# Patient Record
Sex: Male | Born: 1955 | Race: White | Hispanic: No | Marital: Married | Smoking: Former smoker
Health system: Southern US, Community
[De-identification: ages and names within clinical notes are randomized; demographics above are authoritative.]

## PROBLEM LIST (undated history)

## (undated) DIAGNOSIS — H269 Unspecified cataract: Secondary | ICD-10-CM

## (undated) DIAGNOSIS — E785 Hyperlipidemia, unspecified: Secondary | ICD-10-CM

## (undated) DIAGNOSIS — E1149 Type 2 diabetes mellitus with other diabetic neurological complication: Secondary | ICD-10-CM

## (undated) DIAGNOSIS — F419 Anxiety disorder, unspecified: Secondary | ICD-10-CM

## (undated) DIAGNOSIS — T783XXA Angioneurotic edema, initial encounter: Secondary | ICD-10-CM

## (undated) DIAGNOSIS — E119 Type 2 diabetes mellitus without complications: Secondary | ICD-10-CM

## (undated) DIAGNOSIS — L309 Dermatitis, unspecified: Secondary | ICD-10-CM

## (undated) DIAGNOSIS — K635 Polyp of colon: Secondary | ICD-10-CM

## (undated) DIAGNOSIS — T7840XA Allergy, unspecified, initial encounter: Secondary | ICD-10-CM

## (undated) DIAGNOSIS — K219 Gastro-esophageal reflux disease without esophagitis: Secondary | ICD-10-CM

## (undated) HISTORY — DX: Allergy, unspecified, initial encounter: T78.40XA

## (undated) HISTORY — DX: Type 2 diabetes mellitus with other diabetic neurological complication: E11.49

## (undated) HISTORY — DX: Angioneurotic edema, initial encounter: T78.3XXA

## (undated) HISTORY — DX: Hyperlipidemia, unspecified: E78.5

## (undated) HISTORY — PX: COLONOSCOPY W/ BIOPSIES AND POLYPECTOMY: SHX1376

## (undated) HISTORY — DX: Unspecified cataract: H26.9

## (undated) HISTORY — DX: Dermatitis, unspecified: L30.9

## (undated) HISTORY — PX: RETINAL DETACHMENT SURGERY: SHX105

## (undated) HISTORY — DX: Polyp of colon: K63.5

## (undated) HISTORY — PX: WISDOM TOOTH EXTRACTION: SHX21

## (undated) HISTORY — DX: Anxiety disorder, unspecified: F41.9

## (undated) HISTORY — DX: Type 2 diabetes mellitus without complications: E11.9

## (undated) HISTORY — DX: Gastro-esophageal reflux disease without esophagitis: K21.9

---

## 2015-08-28 ENCOUNTER — Other Ambulatory Visit (HOSPITAL_COMMUNITY): Payer: Self-pay | Admitting: Family

## 2015-08-28 ENCOUNTER — Other Ambulatory Visit (HOSPITAL_COMMUNITY): Payer: Self-pay | Admitting: *Deleted

## 2015-08-28 DIAGNOSIS — S60222A Contusion of left hand, initial encounter: Secondary | ICD-10-CM

## 2015-08-28 DIAGNOSIS — M25512 Pain in left shoulder: Secondary | ICD-10-CM

## 2015-09-13 ENCOUNTER — Ambulatory Visit (HOSPITAL_COMMUNITY): Admission: RE | Admit: 2015-09-13 | Payer: Worker's Compensation | Source: Ambulatory Visit

## 2015-09-24 ENCOUNTER — Ambulatory Visit (HOSPITAL_COMMUNITY): Admission: RE | Admit: 2015-09-24 | Payer: Worker's Compensation | Source: Ambulatory Visit

## 2017-02-23 ENCOUNTER — Ambulatory Visit: Payer: Self-pay | Admitting: Surgery

## 2017-02-23 NOTE — H&P (Signed)
Danny Morgan 02/23/2017 10:15 AM Location: [facility] Patient #: [id number] DOB: 01-Jul-1956 Undefined / Language: Cleophus Molt / Race: White Male  History of Present Illness Adin Hector MD; 02/23/2017 11:59 AM) The patient is a 61 year old male who presents with an umbilical hernia. Note for "Umbilical hernia": ` ` ` Patient sent for surgical consultation at the request of Dr. Synetta Shadow  Chief Complaint: Umbilical hernia  The patient is a active male. Felt some abdominal pain and discomfort will always doing some lifting at work. 29th of January. Felt a definite pop. Then started noticing a lump. Through his Worker's Compensation evaluation, examination was suspicious for an umbilical hernia. He's noticed since gotten a little bit larger. It started to bother him more. Surgical consultation requested. The patient comes in today with his wife. He's been diagnosed with diabetes. Has not had his glucoses checked in a while. He is working to try and get his health on her better order. He otherwise is rather active. Hard to stay pleasant. He's had acute episodes of chest pressure anxiety. Wondered if he had panic attacks. Never really had any chest depression or discomfort at work or with lifting. Bowels every day. No bowel bouts of constipation diarrhea. Father had myocardial infarction but was in his 61s. Mother passed away which they think maybe from a stroke. He does not smoke. He believes he was diagnosed with sleep apnea from her prior employee evaluation but could not tolerate the CPAP machine due to recurrent sinisitis infections despite changing masks and cleaning regimens. Wife notes he snores if he lies on his back, but he intentionally does not sleep that way more. He tries to avoid and it is not as much of an issue.  (Review of systems as stated in this history (HPI) or in the review of systems. Otherwise all other 12 point ROS are  negative)   Past Surgical History Dalbert Mayotte, [place]; 02/23/2017 10:15 AM) No pertinent past surgical history  Diagnostic Studies History Dalbert Mayotte, [place]; 02/23/2017 10:15 AM) Colonoscopy never  Allergies Dalbert Mayotte, CMA; 02/23/2017 10:18 AM) No Known Drug Allergies 02/23/2017 Allergies Reconciled  Medication History Dalbert Mayotte, CMA; 02/23/2017 10:18 AM) MetFORMIN HCl (850MG  Tablet, Oral) Active. GlipiZIDE ER (5MG  Tablet ER 24HR, Oral) Active. Medications Reconciled  Social History Dalbert Mayotte, [place]; 02/23/2017 10:15 AM) Alcohol use Occasional alcohol use. Caffeine use Coffee. No drug use Tobacco use Former smoker.  Family History Dalbert Mayotte, [place]; 02/23/2017 10:15 AM) Family history unknown First Degree Relatives  Other Problems Dalbert Mayotte, CMA; 02/23/2017 10:15 AM) Anxiety Disorder Diabetes Mellitus Gastroesophageal Reflux Disease Hemorrhoids Migraine Headache Umbilical Hernia Repair     Review of Systems Dalbert Mayotte CMA; 02/23/2017 10:15 AM) General Not Present- Appetite Loss, Chills, Fatigue, Fever, Night Sweats, Weight Gain and Weight Loss. HEENT Present- Nose Bleed and Wears glasses/contact lenses. Not Present- Earache, Hearing Loss, Hoarseness, Oral Ulcers, Ringing in the Ears, Seasonal Allergies, Sinus Pain, Sore Throat, Visual Disturbances and Yellow Eyes. Respiratory Present- Snoring. Not Present- Bloody sputum, Chronic Cough, Difficulty Breathing and Wheezing. Breast Not Present- Breast Mass, Breast Pain, Nipple Discharge and Skin Changes. Cardiovascular Present- Palpitations and Swelling of Extremities. Not Present- Chest Pain, Difficulty Breathing Lying Down, Leg Cramps, Rapid Heart Rate and Shortness of Breath. Gastrointestinal Present- Bloating, Hemorrhoids and Indigestion. Not Present- Abdominal Pain, Bloody Stool, Change in Bowel Habits, Chronic diarrhea, Constipation, Difficulty Swallowing, Excessive gas, Gets  full quickly at meals, Nausea, Rectal Pain and Vomiting. Male Genitourinary Present- Impotence. Not  Present- Blood in Urine, Change in Urinary Stream, Frequency, Nocturia, Painful Urination, Urgency and Urine Leakage.  Vitals Dalbert Mayotte CMA; 02/23/2017 10:19 AM) 02/23/2017 10:18 AM Weight: 394.2 lb Height: 71in Body Surface Area: 2.81 m Body Mass Index: 54.98 kg/m  Temp.: 97.102F  Pulse: 83 (Regular)  BP: 150/100 (Sitting, Left Arm, Standard)      Physical Exam Adin Hector MD; 02/23/2017 10:51 AM)  General Mental Status-Alert. General Appearance-Not in acute distress, Not Sickly. Orientation-Oriented X3. Hydration-Well hydrated. Voice-Normal.  Integumentary Global Assessment Upon inspection and palpation of skin surfaces of the - Axillae: non-tender, no inflammation or ulceration, no drainage. and Distribution of scalp and body hair is normal. General Characteristics Temperature - normal warmth is noted.  Head and Neck Head-normocephalic, atraumatic with no lesions or palpable masses. Face Global Assessment - atraumatic, no absence of expression. Neck Global Assessment - no abnormal movements, no bruit auscultated on the right, no bruit auscultated on the left, no decreased range of motion, non-tender. Trachea-midline. Thyroid Gland Characteristics - non-tender.  Eye Eyeball - Left-Extraocular movements intact, No Nystagmus. Eyeball - Right-Extraocular movements intact, No Nystagmus. Cornea - Left-No Hazy. Cornea - Right-No Hazy. Sclera/Conjunctiva - Left-No scleral icterus, No Discharge. Sclera/Conjunctiva - Right-No scleral icterus, No Discharge. Pupil - Left-Direct reaction to light normal. Pupil - Right-Direct reaction to light normal.  ENMT Ears Pinna - Left - no drainage observed, no generalized tenderness observed. Right - no drainage observed, no generalized tenderness observed. Nose and Sinuses External  Inspection of the Nose - no destructive lesion observed. Inspection of the nares - Left - quiet respiration. Right - quiet respiration. Mouth and Throat Lips - Upper Lip - no fissures observed, no pallor noted. Lower Lip - no fissures observed, no pallor noted. Nasopharynx - no discharge present. Oral Cavity/Oropharynx - Tongue - no dryness observed. Oral Mucosa - no cyanosis observed. Hypopharynx - no evidence of airway distress observed.  Chest and Lung Exam Inspection Movements - Normal and Symmetrical. Accessory muscles - No use of accessory muscles in breathing. Palpation Palpation of the chest reveals - Non-tender. Auscultation Breath sounds - Normal and Clear.  Cardiovascular Auscultation Rhythm - Regular. Murmurs & Other Heart Sounds - Auscultation of the heart reveals - No Murmurs and No Systolic Clicks.  Abdomen Inspection Inspection of the abdomen reveals - No Visible peristalsis and No Abnormal pulsations. Umbilicus - No Bleeding, No Urine drainage. Palpation/Percussion Palpation and Percussion of the abdomen reveal - Soft, Non Tender, No Rebound tenderness, No Rigidity (guarding) and No Cutaneous hyperesthesia. Note: Overweight but soft. Mild suprapubic umbilical diastases recti. 2-1/2 cm mass that reduces down consistent with umbilical hernia. Sensitive but reducible. Rest the abdomen nontender. No guarding.  Male Genitourinary Sexual Maturity Tanner 5 - Adult hair pattern and Adult penile size and shape. Note: Definite right inguinal hernia with Valsalva. Most likely indirect/lateral. No definite hernia on the left side. External genitalia.  Peripheral Vascular Upper Extremity Inspection - Left - No Cyanotic nailbeds, Not Ischemic. Right - No Cyanotic nailbeds, Not Ischemic.  Neurologic Neurologic evaluation reveals -normal attention span and ability to concentrate, able to name objects and repeat phrases. Appropriate fund of knowledge , normal sensation and normal  coordination. Mental Status Affect - not angry, not paranoid. Cranial Nerves-Normal Bilaterally. Gait-Normal.  Neuropsychiatric Mental status exam performed with findings of-able to articulate well with normal speech/language, rate, volume and coherence, thought content normal with ability to perform basic computations and apply abstract reasoning and no evidence of hallucinations, delusions, obsessions  or homicidal/suicidal ideation.  Musculoskeletal Global Assessment Spine, Ribs and Pelvis - no instability, subluxation or laxity. Right Upper Extremity - no instability, subluxation or laxity.  Lymphatic Head & Neck  General Head & Neck Lymphatics: Bilateral - Description - No Localized lymphadenopathy. Axillary  General Axillary Region: Bilateral - Description - No Localized lymphadenopathy. Femoral & Inguinal  Generalized Femoral & Inguinal Lymphatics: Left - Description - No Localized lymphadenopathy. Right - Description - No Localized lymphadenopathy.    Assessment & Plan Adin Hector MD; 7/82/4235 36:14 AM)  UMBILICAL HERNIA WITHOUT OBSTRUCTION AND WITHOUT GANGRENE (K42.9) Impression: Umbilical hernia related to lifting episode. Now becoming larger and more symptomatic.  I think he would benefit from surgical repair. Given his size and physical activity, am skeptical stitches only will be adequate. Therefore mesh underlay repair. He is interested in proceeding. Wife is as well.  Current Plans The anatomy & physiology of the abdominal wall was discussed. The pathophysiology of hernias was discussed. Natural history risks without surgery including progeressive enlargement, pain, incarceration, & strangulation was discussed. Contributors to complications such as smoking, obesity, diabetes, prior surgery, etc were discussed.  I feel the risks of no intervention will lead to serious problems that outweigh the operative risks; therefore, I recommended surgery to  reduce and repair the hernia. I explained laparoscopic techniques with possible need for an open approach. I noted the probable use of mesh to patch and/or buttress the hernia repair  Risks such as bleeding, infection, abscess, need for further treatment, heart attack, death, and other risks were discussed. I noted a good likelihood this will help address the problem. Goals of post-operative recovery were discussed as well. Possibility that this will not correct all symptoms was explained. I stressed the importance of low-impact activity, aggressive pain control, avoiding constipation, & not pushing through pain to minimize risk of post-operative chronic pain or injury. Possibility of reherniation especially with smoking, obesity, diabetes, immunosuppression, and other health conditions was discussed. We will work to minimize complications.  An educational handout further explaining the pathology & treatment options was given as well. Questions were answered. The patient expresses understanding & wishes to proceed with surgery.  RIGHT INGUINAL HERNIA (K40.90) Impression: Definite right inguinal hernia. Less symptomatic. I would be inclined to fix this as well since it will get larger and become an issue at some point. He would like to be aggressive and repair but does not know if that will be covered. We will post and see if that is possibility. I would do diagnostic laparoscopy to make sure that he does not have a contralateral hernia that is even more subtle. Did note there is and over 50% chance he could have a contralateral inguinal hernia. I would like to avoid multiple operations and address all potential hernias with one surgery/ anesthesia. Still most likely be outpatient surgery.  Current Plans The anatomy & physiology of the abdominal wall and pelvic floor was discussed. The pathophysiology of hernias in the inguinal and pelvic region was discussed. Natural history risks such as  progressive enlargement, pain, incarceration, and strangulation was discussed. Contributors to complications such as smoking, obesity, diabetes, prior surgery, etc were discussed.  I feel the risks of no intervention will lead to serious problems that outweigh the operative risks; therefore, I recommended surgery to reduce and repair the hernia. I explained laparoscopic techniques with possible need for an open approach. I noted usual use of mesh to patch and/or buttress hernia repair  Risks such as bleeding,  infection, abscess, need for further treatment, heart attack, death, and other risks were discussed. I noted a good likelihood this will help address the problem. Goals of post-operative recovery were discussed as well. Possibility that this will not correct all symptoms was explained. I stressed the importance of low-impact activity, aggressive pain control, avoiding constipation, & not pushing through pain to minimize risk of post-operative chronic pain or injury. Possibility of reherniation was discussed. We will work to minimize complications.  An educational handout further explaining the pathology & treatment options was given as well. Questions were answered. The patient expresses understanding & wishes to proceed with surgery.  PREOP - [place] - ENCOUNTER FOR PREOPERATIVE EXAMINATION FOR GENERAL SURGICAL PROCEDURE (Z01.818)  Current Plans You are being scheduled for surgery- Our schedulers will call you.  You should hear from our office's scheduling department within 5 working days about the location, date, and time of surgery. We try to make accommodations for patient's preferences in scheduling surgery, but sometimes the OR schedule or the surgeon's schedule prevents [place] from making those accommodations.  If you have not heard from our office [phone]) in 5 working days, call the office and ask for your surgeon's nurse.  If you have other questions about your  diagnosis, plan, or surgery, call the office and ask for your surgeon's nurse.  Written instructions provided Pt Education - [facility] Hernia Post-Op HCI (Odis Wickey): discussed with patient and provided information. Pt Education - [facility] Pain Control (Romano Stigger) Pt Education - Pamphlet Given - Laparoscopic Hernia Repair: discussed with patient and provided information.  Adin Hector, M.D., F.A.C.S. Gastrointestinal and Minimally Invasive Surgery [facility] [address], [place], [place] [id number] [phone] Main / Paging

## 2017-09-23 ENCOUNTER — Encounter: Payer: Self-pay | Admitting: Gastroenterology

## 2017-10-25 ENCOUNTER — Telehealth: Payer: Self-pay | Admitting: *Deleted

## 2017-10-25 NOTE — Telephone Encounter (Signed)
Patient no show PV today. Called pt, no answer, left message for pt to call us back to reschedule pv before 5 pm today.

## 2017-10-29 ENCOUNTER — Telehealth: Payer: Self-pay | Admitting: *Deleted

## 2017-10-29 ENCOUNTER — Other Ambulatory Visit: Payer: Self-pay

## 2017-10-29 ENCOUNTER — Ambulatory Visit (AMBULATORY_SURGERY_CENTER): Payer: Self-pay | Admitting: *Deleted

## 2017-10-29 VITALS — Ht 71.0 in | Wt 293.2 lb

## 2017-10-29 DIAGNOSIS — Z1211 Encounter for screening for malignant neoplasm of colon: Secondary | ICD-10-CM

## 2017-10-29 MED ORDER — NA SULFATE-K SULFATE-MG SULF 17.5-3.13-1.6 GM/177ML PO SOLN: 1.0000 [IU] | Freq: Once | ORAL | 0 refills | Status: AC

## 2017-10-29 NOTE — Telephone Encounter (Signed)
Patient no showed for pre visit appointment today at 11 am. Attempted to call patient and no answer.  Left detailed message on voicemail for patient to call and reschedule his pre visit by 5 pm today.  Advised if I did not hear back by 5 today, I will cancel both pre visit appointment and colonoscopy appointment.   Elizabeth Palau, CMA  PV

## 2017-10-29 NOTE — Progress Notes (Signed)
No egg or soy allergy known to patient  No issues with past sedation with any surgeries  or procedures, no intubation problems  Never been sedated  No diet pills per patient No home 02 use per patient  No blood thinners per patient  Pt denies issues with constipation  No A fib or A flutter  EMMI video sent to pt's e mail

## 2017-11-01 ENCOUNTER — Encounter: Payer: Self-pay | Admitting: Gastroenterology

## 2017-11-01 NOTE — Telephone Encounter (Signed)
Patient came in at later time for PV.  B.Benitez, CMA  PV

## 2017-11-08 ENCOUNTER — Other Ambulatory Visit: Payer: Self-pay

## 2017-11-08 ENCOUNTER — Ambulatory Visit (AMBULATORY_SURGERY_CENTER): Payer: 59 | Admitting: Gastroenterology

## 2017-11-08 ENCOUNTER — Encounter: Payer: Self-pay | Admitting: Gastroenterology

## 2017-11-08 VITALS — BP 115/75 | HR 69 | Temp 98.4°F | Resp 12 | Ht 71.0 in | Wt 293.0 lb

## 2017-11-08 DIAGNOSIS — D128 Benign neoplasm of rectum: Secondary | ICD-10-CM

## 2017-11-08 DIAGNOSIS — Z1211 Encounter for screening for malignant neoplasm of colon: Secondary | ICD-10-CM

## 2017-11-08 DIAGNOSIS — D122 Benign neoplasm of ascending colon: Secondary | ICD-10-CM

## 2017-11-08 DIAGNOSIS — D123 Benign neoplasm of transverse colon: Secondary | ICD-10-CM | POA: Diagnosis not present

## 2017-11-08 DIAGNOSIS — D127 Benign neoplasm of rectosigmoid junction: Secondary | ICD-10-CM

## 2017-11-08 MED ORDER — SODIUM CHLORIDE 0.9 % IV SOLN: 500.0000 mL | Freq: Once | INTRAVENOUS | Status: DC

## 2017-11-08 NOTE — Progress Notes (Signed)
Called to room to assist during endoscopic procedure.  Patient ID and intended procedure confirmed with present staff. Received instructions for my participation in the procedure from the performing physician.  

## 2017-11-08 NOTE — Progress Notes (Signed)
No changes in medical or surgical hx since PV per pt 

## 2017-11-08 NOTE — Progress Notes (Signed)
Report given to PACU, vss 

## 2017-11-08 NOTE — Op Note (Signed)
[facility] Patient Name: Danny Morgan Procedure Date: 11/08/2017 7:49 AM MRN: [id number] Endoscopist: Remo Lipps P. Ingris Pasquarella MD, MD Age: 62 Referring MD:  Date of Birth: 1956/05/29 Gender: Male Account #: [id number] Procedure:                Colonoscopy Indications:              Screening for colorectal malignant neoplasm, This                            is the patient's first colonoscopy Medicines:                Monitored Anesthesia Care Procedure:                Pre-Anesthesia Assessment:                           - Prior to the procedure, a History and Physical                            was performed, and patient medications and                            allergies were reviewed. The patient's tolerance of                            previous anesthesia was also reviewed. The risks                            and benefits of the procedure and the sedation                            options and risks were discussed with the patient.                            All questions were answered, and informed consent                            was obtained. Prior Anticoagulants: The patient has                            taken no previous anticoagulant or antiplatelet                            agents. ASA Grade Assessment: II - A patient with                            mild systemic disease. After reviewing the risks                            and benefits, the patient was deemed in                            satisfactory condition to undergo the procedure.  After obtaining informed consent, the colonoscope                            was passed under direct vision. Throughout the                            procedure, the patient's blood pressure, pulse, and                            oxygen saturations were monitored continuously. The                            Colonoscope was introduced through the anus and                            advanced to the the  cecum, identified by                            appendiceal orifice and ileocecal valve. The                            colonoscopy was performed without difficulty. The                            patient tolerated the procedure well. The quality                            of the bowel preparation was good. The ileocecal                            valve, appendiceal orifice, and rectum were                            photographed. Scope In: 8:00:34 AM Scope Out: 8:23:05 AM Scope Withdrawal Time: 0 hours 18 minutes 14 seconds  Total Procedure Duration: 0 hours 22 minutes 31 seconds  Findings:                 The perianal and digital rectal examinations were                            normal.                           A 4 mm polyp was found in the ascending colon. The                            polyp was sessile. The polyp was removed with a                            cold snare. Resection and retrieval were complete.                           Three sessile polyps were found in the transverse  colon. The polyps were 4 to 5 mm in size. These                            polyps were removed with a cold snare. Resection                            and retrieval were complete.                           Two semi-pedunculated polyps were found in the                            recto-sigmoid colon. The polyps were 6 to 7 mm in                            size. These polyps were removed with a hot snare.                            Resection and retrieval were complete.                           A 4 mm polyp was found in the rectum. The polyp was                            sessile. The polyp was removed with a cold snare.                            Resection and retrieval were complete.                           A few small-mouthed diverticula were found in the                            sigmoid colon.                           Internal hemorrhoids were found during                             retroflexion. The hemorrhoids were moderate.                           The exam was otherwise without abnormality. Complications:            No immediate complications. Estimated blood loss:                            Minimal. Estimated Blood Loss:     Estimated blood loss was minimal. Impression:               - One 4 mm polyp in the ascending colon, removed                            with a cold snare. Resected and retrieved.                           -  Three 4 to 5 mm polyps in the transverse colon,                            removed with a cold snare. Resected and retrieved.                           - Two 6 to 7 mm polyps at the recto-sigmoid colon,                            removed with a hot snare. Resected and retrieved.                           - One 4 mm polyp in the rectum, removed with a cold                            snare. Resected and retrieved.                           - Diverticulosis in the sigmoid colon.                           - Internal hemorrhoids.                           - The examination was otherwise normal. Recommendation:           - Patient has a contact number available for                            emergencies. The signs and symptoms of potential                            delayed complications were discussed with the                            patient. Return to normal activities tomorrow.                            Written discharge instructions were provided to the                            patient.                           - Resume previous diet.                           - Continue present medications.                           - Await pathology results.                           - Repeat colonoscopy is recommended for  surveillance. The colonoscopy date will be                            determined after pathology results from today's                            exam become available for review.                            - No ibuprofen, naproxen, or other non-steroidal                            anti-inflammatory drugs for 2 weeks after polyp                            removal. Remo Lipps P. Jayden Kratochvil MD, MD 11/08/2017 8:28:18 AM This report has been signed electronically.

## 2017-11-08 NOTE — Patient Instructions (Signed)
Discharge instructions given. Handouts on polyps,diverticulosis and hemorrhoids. Resume previous medications. No ibuprofen, naproxen, or other NSAIDs for two weeks.YOU HAD AN ENDOSCOPIC PROCEDURE TODAY AT THE [facility]:   Refer to the procedure report that was given to you for any specific questions about what was found during the examination.  If the procedure report does not answer your questions, please call your gastroenterologist to clarify.  If you requested that your care partner not be given the details of your procedure findings, then the procedure report has been included in a sealed envelope for you to review at your convenience later.  YOU SHOULD EXPECT: Some feelings of bloating in the abdomen. Passage of more gas than usual.  Walking can help get rid of the air that was put into your GI tract during the procedure and reduce the bloating. If you had a lower endoscopy (such as a colonoscopy or flexible sigmoidoscopy) you may notice spotting of blood in your stool or on the toilet paper. If you underwent a bowel prep for your procedure, you may not have a normal bowel movement for a few days.  Please Note:  You might notice some irritation and congestion in your nose or some drainage.  This is from the oxygen used during your procedure.  There is no need for concern and it should clear up in a day or so.  SYMPTOMS TO REPORT IMMEDIATELY:   Following lower endoscopy (colonoscopy or flexible sigmoidoscopy):  Excessive amounts of blood in the stool  Significant tenderness or worsening of abdominal pains  Swelling of the abdomen that is new, acute  Fever of 100F or higher   For urgent or emergent issues, a gastroenterologist can be reached at any hour by calling [phone].   DIET:  We do recommend a small meal at first, but then you may proceed to your regular diet.  Drink plenty of fluids but you should avoid alcoholic beverages for 24 hours.  ACTIVITY:  You should  plan to take it easy for the rest of today and you should NOT DRIVE or use heavy machinery until tomorrow (because of the sedation medicines used during the test).    FOLLOW UP: Our staff will call the number listed on your records the next business day following your procedure to check on you and address any questions or concerns that you may have regarding the information given to you following your procedure. If we do not reach you, we will leave a message.  However, if you are feeling well and you are not experiencing any problems, there is no need to return our call.  We will assume that you have returned to your regular daily activities without incident.  If any biopsies were taken you will be contacted by phone or by letter within the next 1-3 weeks.  Please call us at [phone] if you have not heard about the biopsies in 3 weeks.    SIGNATURES/CONFIDENTIALITY: You and/or your care partner have signed paperwork which will be entered into your electronic medical record.  These signatures attest to the fact that that the information above on your After Visit Summary has been reviewed and is understood.  Full responsibility of the confidentiality of this discharge information lies with you and/or your care-partner. 

## 2017-11-09 ENCOUNTER — Telehealth: Payer: Self-pay

## 2017-11-09 NOTE — Telephone Encounter (Signed)
Pt is returning your call and has some concerns he would like to discuss [phone]

## 2017-11-09 NOTE — Telephone Encounter (Signed)
  Follow up Call-  Call back number 11/08/2017  Post procedure Call Back phone  # [phone]  Permission to leave phone message Yes     Patient questions:  Do you have a fever, pain , or abdominal swelling? No. Pain Score  0 *  Have you tolerated food without any problems? Yes.    Have you been able to return to your normal activities? Yes.    Do you have any questions about your discharge instructions: Diet   No. Medications  No. Follow up visit  No.  Do you have questions or concerns about your Care? No.  Actions: * If pain score is 4 or above: No action needed, pain <4.  Patient said he was cramping a little this morning but feeling better after moving around. Also said his anus was sore, I instructed him to use some Desitin or A&D ointment if he needed to. He understood and wanted to thank everyone for the excellent care he received during his procedure.

## 2017-11-09 NOTE — Telephone Encounter (Signed)
  Follow up Call-  Call back number 11/08/2017  Post procedure Call Back phone  # [phone]  Permission to leave phone message Yes     Left message

## 2017-11-12 ENCOUNTER — Encounter: Payer: Self-pay | Admitting: Gastroenterology

## 2019-08-24 DIAGNOSIS — Z20828 Contact with and (suspected) exposure to other viral communicable diseases: Secondary | ICD-10-CM | POA: Diagnosis not present

## 2019-08-29 DIAGNOSIS — Z20828 Contact with and (suspected) exposure to other viral communicable diseases: Secondary | ICD-10-CM | POA: Diagnosis not present

## 2019-09-06 DIAGNOSIS — U071 COVID-19: Secondary | ICD-10-CM | POA: Diagnosis not present

## 2019-09-06 DIAGNOSIS — J329 Chronic sinusitis, unspecified: Secondary | ICD-10-CM | POA: Diagnosis not present

## 2019-09-28 DIAGNOSIS — E119 Type 2 diabetes mellitus without complications: Secondary | ICD-10-CM | POA: Diagnosis not present

## 2019-09-28 DIAGNOSIS — Z638 Other specified problems related to primary support group: Secondary | ICD-10-CM | POA: Diagnosis not present

## 2019-09-28 DIAGNOSIS — U071 COVID-19: Secondary | ICD-10-CM | POA: Diagnosis not present

## 2019-09-28 DIAGNOSIS — G5793 Unspecified mononeuropathy of bilateral lower limbs: Secondary | ICD-10-CM | POA: Diagnosis not present

## 2019-10-13 DIAGNOSIS — U071 COVID-19: Secondary | ICD-10-CM | POA: Diagnosis not present

## 2019-10-13 DIAGNOSIS — R0602 Shortness of breath: Secondary | ICD-10-CM | POA: Diagnosis not present

## 2019-10-26 DIAGNOSIS — F4323 Adjustment disorder with mixed anxiety and depressed mood: Secondary | ICD-10-CM | POA: Diagnosis not present

## 2019-10-26 DIAGNOSIS — G5793 Unspecified mononeuropathy of bilateral lower limbs: Secondary | ICD-10-CM | POA: Diagnosis not present

## 2019-10-26 DIAGNOSIS — U071 COVID-19: Secondary | ICD-10-CM | POA: Diagnosis not present

## 2019-10-26 DIAGNOSIS — E119 Type 2 diabetes mellitus without complications: Secondary | ICD-10-CM | POA: Diagnosis not present

## 2020-04-12 ENCOUNTER — Other Ambulatory Visit: Payer: Self-pay

## 2020-04-12 ENCOUNTER — Emergency Department (INDEPENDENT_AMBULATORY_CARE_PROVIDER_SITE_OTHER)
Admission: EM | Admit: 2020-04-12 | Discharge: 2020-04-12 | Disposition: A | Discharge status: Home / Self Care | Payer: BC Managed Care – PPO | Source: Home / Self Care | Attending: Family Medicine | Admitting: Family Medicine

## 2020-04-12 DIAGNOSIS — J069 Acute upper respiratory infection, unspecified: Secondary | ICD-10-CM

## 2020-04-12 NOTE — ED Triage Notes (Signed)
Patient presents to Urgent Care with complaints of nasal congestion and other viral illness sx since earlier this week. Patient reports someone else at his job just tested for covid, pt has been vaccinated and had covid last year (December).

## 2020-04-12 NOTE — ED Provider Notes (Signed)
[name]    CSN: [id number] Arrival date & time: 04/12/20  1309      History   Chief Complaint Chief Complaint  Patient presents with   Nasal Congestion    HPI Danny Morgan is a 64 y.o. male.   Yesterday patient developed cold-like symptoms including cough, sore throat, and sinus congestion.  He denies fevers, chills, and sweats.  He denies chest tightness, shortness of breath, and changes in taste/smell.    The history is provided by the patient.    Past Medical History:  Diagnosis Date   Allergy    Anxiety    Cataract    beginning stage in rt. eye   Diabetes mellitus without complication (HCC)    GERD (gastroesophageal reflux disease)     There are no problems to display for this patient.   Past Surgical History:  Procedure Laterality Date   RETINAL DETACHMENT SURGERY     age 64   [place] Medications    Prior to Admission medications   Medication Sig Start Date End Date Taking? Authorizing Provider  atorvastatin (LIPITOR) 40 MG tablet Take 1 tablet by mouth daily. 10/22/17   [provider]  Cetirizine-Pseudoephedrine (ZYRTEC-D PO) Take by mouth daily.    [provider]  Cholecalciferol (VITAMIN D PO) Take by mouth daily.    [provider]  diclofenac sodium (VOLTAREN) 1 % GEL AS DIRECTED EVERY 6-8 HOURS EXTERNALLY 14 DAYS 10/13/17   [provider]  glipiZIDE (GLUCOTROL XL) 10 MG 24 hr tablet Take 10 mg by mouth daily. 10/22/17   [provider]  ibuprofen (ADVIL,MOTRIN) 600 MG tablet 1 TABLET EVERY 6-8 HOURS BY MOUTH 07 DAYS 10/13/17   [provider]  Loratadine (CLARITIN PO) Take by mouth daily.    [provider]  LORazepam (ATIVAN) 1 MG tablet 1 mg as needed for anxiety.    [provider]  metFORMIN (GLUCOPHAGE) 1000 MG tablet 1 TABLET WITH A MEAL 2 TIMES A DAY ORALLY 90 DAYS 10/22/17   [provider]  omeprazole  (PRILOSEC) 10 MG capsule Take by mouth.    [provider]    Family History Family History  Problem Relation Age of Onset   Parkinson's disease Father    Colon polyps Neg Hx    Colon cancer Neg Hx    Esophageal cancer Neg Hx    Rectal cancer Neg Hx    Stomach cancer Neg Hx     Social History Social History   Tobacco Use   Smoking status: Former Smoker    Types: Cigarettes   Smokeless tobacco: Never Used   Tobacco comment: quit at age 64  Vaping Use   Vaping Use: Never used  Substance Use Topics   Alcohol use: Yes    Comment: rarely   Drug use: No     Allergies   Penicillins   Review of Systems Review of Systems + sore throat + cough No pleuritic pain No wheezing + nasal congestion + post-nasal drainage No sinus pain/pressure No itchy/red eyes No earache No hemoptysis No SOB No fever/chills No nausea No vomiting No abdominal pain No diarrhea No urinary symptoms No skin rash No fatigue No myalgias No headache Used OTC meds (Zicam)  without relief   Physical Exam Triage Vital Signs ED Triage Vitals  Enc Vitals Group     BP 04/12/20 1322 (!) 178/95     Pulse Rate  04/12/20 1322 83     Resp 04/12/20 1322 16     Temp 04/12/20 1322 99.4 F (37.4 C)     Temp Source 04/12/20 1322 Oral     SpO2 04/12/20 1322 99 %     Weight --      Height --      Head Circumference --      Peak Flow --      Pain Score 04/12/20 1320 0     Pain Loc --      Pain Edu? --      Excl. in [place]? --    No data found.  Updated Vital Signs BP (!) 178/95 (BP Location: Right Arm)    Pulse 83    Temp 99.4 F (37.4 C) (Oral)    Resp 16    SpO2 99%   Visual Acuity Right Eye Distance:   Left Eye Distance:   Bilateral Distance:    Right Eye Near:   Left Eye Near:    Bilateral Near:     Physical Exam Nursing notes and Vital Signs reviewed. Appearance:  Patient appears stated age, and in no acute distress Eyes:  Pupils are equal, round, and  reactive to light and accomodation.  Extraocular movement is intact.  Conjunctivae are not inflamed  Ears:  Canals normal.  Tympanic membranes normal.  Nose:  Mildly congested turbinates.  No sinus tenderness.  Pharynx:  Normal Neck:  Supple.  Mildly enlarged lateral nodes are present, tender to palpation on the left.   Lungs:  Clear to auscultation.  Breath sounds are equal.  Moving air well. Heart:  Regular rate and rhythm without murmurs, rubs, or gallops.  Abdomen:  Nontender without masses or hepatosplenomegaly.  Bowel sounds are present.  No CVA or flank tenderness.  Extremities:  No edema.  Skin:  No rash present.   UC Treatments / Results  Labs (all labs ordered are listed, but only abnormal results are displayed) Labs Reviewed  SARS-COV-2 RNA,(COVID-19) QUALITATIVE NAAT    EKG   Radiology No results found.  Procedures Procedures (including critical care time)  Medications Ordered in UC Medications - No data to display  Initial Impression / Assessment and Plan / UC Course  I have reviewed the triage vital signs and the nursing notes.  Pertinent labs & imaging results that were available during my care of the patient were reviewed by me and considered in my medical decision making (see chart for details).    Benign exam.  There is no evidence of bacterial infection today.  Treat symptomatically for now  COVID19 send out test.   Final Clinical Impressions(s) / UC Diagnoses   Final diagnoses:  Viral URI with cough     Discharge Instructions     Take plain guaifenesin (1200mg  extended release tabs such as Mucinex) twice daily, with plenty of water, for cough and congestion.  May add Pseudoephedrine (30mg , one or two every 4 to 6 hours) for sinus congestion.  Get adequate rest.   May use Afrin nasal spray (or generic oxymetazoline) each morning for about 5 days and then discontinue.  Also recommend using saline nasal spray several times daily and saline nasal  irrigation (AYR is a common brand).  Use Flonase nasal spray each morning after using Afrin nasal spray and saline nasal irrigation. Try warm salt water gargles for sore throat.  Stop all antihistamines for now, and other non-prescription cough/cold preparations. May take Delsym Cough Suppressant ("12 Hour Cough Relief") at bedtime  for nighttime cough.  May take Tylenol as needed for body aches, fever, etc.   Isolate yourself until COVID-19 test result is available.   If your COVID19 test is positive, then you are infected with the novel coronavirus and could give the virus to others.  Please continue isolation at home for at least 10 days since the start of your symptoms.  Once you complete your 10 day quarantine, you may return to normal activities as long as you've not had a fever for over 24 hours (without taking fever reducing medicine) and your symptoms are improving. Please continue good preventive care measures, including:  frequent hand-washing, avoid touching your face, cover coughs/sneezes, stay out of crowds and keep a 6 foot distance from others.  Go to the nearest hospital emergency room if fever/cough/breathlessness are severe or illness seems like a threat to life.     ED Prescriptions    None        Kandra Nicolas, MD 04/14/20 1253

## 2020-04-12 NOTE — Discharge Instructions (Addendum)
Take plain guaifenesin (1200mg  extended release tabs such as Mucinex) twice daily, with plenty of water, for cough and congestion.  May add Pseudoephedrine (30mg , one or two every 4 to 6 hours) for sinus congestion.  Get adequate rest.   May use Afrin nasal spray (or generic oxymetazoline) each morning for about 5 days and then discontinue.  Also recommend using saline nasal spray several times daily and saline nasal irrigation (AYR is a common brand).  Use Flonase nasal spray each morning after using Afrin nasal spray and saline nasal irrigation. Try warm salt water gargles for sore throat.  Stop all antihistamines for now, and other non-prescription cough/cold preparations. May take Delsym Cough Suppressant ("12 Hour Cough Relief") at bedtime for nighttime cough.  May take Tylenol as needed for body aches, fever, etc.   Isolate yourself until COVID-19 test result is available.   If your COVID19 test is positive, then you are infected with the novel coronavirus and could give the virus to others.  Please continue isolation at home for at least 10 days since the start of your symptoms.  Once you complete your 10 day quarantine, you may return to normal activities as long as you've not had a fever for over 24 hours (without taking fever reducing medicine) and your symptoms are improving. Please continue good preventive care measures, including:  frequent hand-washing, avoid touching your face, cover coughs/sneezes, stay out of crowds and keep a 6 foot distance from others.  Go to the nearest hospital emergency room if fever/cough/breathlessness are severe or illness seems like a threat to life.

## 2020-04-13 LAB — SARS-COV-2 RNA,(COVID-19) QUALITATIVE NAAT: SARS CoV2 RNA: NOT DETECTED

## 2020-06-05 ENCOUNTER — Emergency Department (INDEPENDENT_AMBULATORY_CARE_PROVIDER_SITE_OTHER): Payer: BC Managed Care – PPO

## 2020-06-05 ENCOUNTER — Other Ambulatory Visit: Payer: Self-pay

## 2020-06-05 ENCOUNTER — Emergency Department
Admission: EM | Admit: 2020-06-05 | Discharge: 2020-06-05 | Disposition: A | Discharge status: Home / Self Care | Payer: BC Managed Care – PPO | Source: Home / Self Care

## 2020-06-05 DIAGNOSIS — Z8616 Personal history of COVID-19: Secondary | ICD-10-CM

## 2020-06-05 DIAGNOSIS — R0602 Shortness of breath: Secondary | ICD-10-CM

## 2020-06-05 DIAGNOSIS — R9431 Abnormal electrocardiogram [ECG] [EKG]: Secondary | ICD-10-CM

## 2020-06-05 DIAGNOSIS — R059 Cough, unspecified: Secondary | ICD-10-CM

## 2020-06-05 DIAGNOSIS — E1165 Type 2 diabetes mellitus with hyperglycemia: Secondary | ICD-10-CM

## 2020-06-05 DIAGNOSIS — R911 Solitary pulmonary nodule: Secondary | ICD-10-CM | POA: Diagnosis not present

## 2020-06-05 DIAGNOSIS — R5383 Other fatigue: Secondary | ICD-10-CM

## 2020-06-05 LAB — POCT URINALYSIS DIP (MANUAL ENTRY)
Bilirubin, UA: NEGATIVE
Blood, UA: NEGATIVE
Glucose, UA: 250 mg/dL — AB
Leukocytes, UA: NEGATIVE
Nitrite, UA: NEGATIVE
Protein Ur, POC: NEGATIVE mg/dL
Spec Grav, UA: 1.02 (ref 1.010–1.025)
Urobilinogen, UA: 0.2 E.U./dL
pH, UA: 5 (ref 5.0–8.0)

## 2020-06-05 LAB — POCT FASTING CBG KUC MANUAL ENTRY: POCT Glucose (KUC): 226 mg/dL — AB (ref 70–99)

## 2020-06-05 MED ORDER — BENZONATATE 100 MG PO CAPS
100.0000 mg | ORAL_CAPSULE | Freq: Three times a day (TID) | ORAL | 0 refills | Status: DC
Start: 2020-06-05 — End: 2020-06-24

## 2020-06-05 MED ORDER — GLIPIZIDE 5 MG PO TABS
5.0000 mg | ORAL_TABLET | Freq: Every day | ORAL | 1 refills | Status: DC
Start: 2020-06-05 — End: 2020-08-12

## 2020-06-05 NOTE — ED Triage Notes (Signed)
Patient presents to Urgent Care with complaints of cough and congestion since July after he got his covid vaccines. Patient reports since yesterday he has been feeling fatigued, left work due to weakness. Feels like his heart skips a beat when he checks his pulse (since July). Pt is diabetic, states he stopped monitoring his blood sugar and goes by how he feels like when his toes tingle he knows it's high. Pt thinks his glucose normally runs about 200-220.

## 2020-06-05 NOTE — ED Provider Notes (Signed)
[name]    CSN: [id number] Arrival date & time: 06/05/20  7371      History   Chief Complaint Chief Complaint  Patient presents with   Cough    HPI Danny Morgan is a 64 y.o. male.   HPI Patient presents today with a concern of cough, congestion, fatigue and feeling weak. Cough and congestion has been present since July patient reports that this has been ongoing since he received both of his Covid vaccines in July. Patient reports that he has felt palpitation or that his heart skipped a beat which caused him to leave work yesterday due to weakness. He does not check his blood sugar however he has diabetes and reports prior to checking blood sugar his baseline was in the 200s. Currently managed with Metformin and glipizide. Has PCP out of town and has not been able to see them as the drive is too far. He also complains of worsening SOB and heart skipping. This  Has been on-going for several months also. BP elevated today and elevated during his visit here in August and he reports no history of hypertension nor prior treatment with antihypertension medications. Denies unintentional weight gain or bilateral lower extremity edema. Past Medical History:  Diagnosis Date   Allergy    Anxiety    Cataract    beginning stage in rt. eye   Diabetes mellitus without complication (HCC)    GERD (gastroesophageal reflux disease)     There are no problems to display for this patient.   Past Surgical History:  Procedure Laterality Date   RETINAL DETACHMENT SURGERY     age 49   [place] Medications    Prior to Admission medications   Medication Sig Start Date End Date Taking? Authorizing Provider  atorvastatin (LIPITOR) 40 MG tablet Take 1 tablet by mouth daily. 10/22/17   [provider]  benzonatate (TESSALON) 100 MG capsule Take 1 capsule (100 mg total) by mouth every 8 (eight) hours. 06/05/20   Scot Jun, FNP    Cetirizine-Pseudoephedrine (ZYRTEC-D PO) Take by mouth daily.    [provider]  Cholecalciferol (VITAMIN D PO) Take by mouth daily.    [provider]  diclofenac sodium (VOLTAREN) 1 % GEL AS DIRECTED EVERY 6-8 HOURS EXTERNALLY 14 DAYS 10/13/17   [provider]  glipiZIDE (GLUCOTROL XL) 10 MG 24 hr tablet Take 10 mg by mouth daily. 10/22/17   [provider]  glipiZIDE (GLUCOTROL) 5 MG tablet Take 1 tablet (5 mg total) by mouth daily with supper. 06/05/20   Scot Jun, FNP  ibuprofen (ADVIL,MOTRIN) 600 MG tablet 1 TABLET EVERY 6-8 HOURS BY MOUTH 07 DAYS 10/13/17   [provider]  Loratadine (CLARITIN PO) Take by mouth daily.    [provider]  LORazepam (ATIVAN) 1 MG tablet 1 mg as needed for anxiety.    [provider]  metFORMIN (GLUCOPHAGE) 1000 MG tablet 1 TABLET WITH A MEAL 2 TIMES A DAY ORALLY 90 DAYS 10/22/17   [provider]  omeprazole (PRILOSEC) 10 MG capsule Take by mouth.    [provider]    Family History Family History  Problem Relation Age of Onset   Parkinson's disease Father    Colon polyps Neg Hx    Colon cancer Neg Hx    Esophageal cancer Neg Hx    Rectal cancer Neg Hx    Stomach cancer Neg Hx  Social History Social History   Tobacco Use   Smoking status: Former Smoker    Types: Cigarettes   Smokeless tobacco: Never Used   Tobacco comment: quit at age 32  Vaping Use   Vaping Use: Never used  Substance Use Topics   Alcohol use: Yes    Comment: rarely   Drug use: No     Allergies   Penicillins   Review of Systems Review of Systems Pertinent negatives listed in HPI  Physical Exam Triage Vital Signs ED Triage Vitals  Enc Vitals Group     BP 06/05/20 0832 (!) 151/87     Pulse Rate 06/05/20 0832 72     Resp 06/05/20 0832 18     Temp 06/05/20 0832 99 F (37.2 C)     Temp Source 06/05/20 0832 Oral     SpO2 06/05/20 0832 97 %     Weight --       Height --      Head Circumference --      Peak Flow --      Pain Score 06/05/20 0831 0     Pain Loc --      Pain Edu? --      Excl. in [place]? --    No data found.  Updated Vital Signs BP (!) 151/87 (BP Location: Right Arm)    Pulse 72    Temp 99 F (37.2 C) (Oral)    Resp 18    Wt 290 lb (131.5 kg)    SpO2 97%    BMI 40.45 kg/m   Visual Acuity Right Eye Distance:   Left Eye Distance:   Bilateral Distance:    Right Eye Near:   Left Eye Near:    Bilateral Near:     Physical Exam Constitutional:      General: He is not in acute distress.    Appearance: He is obese. He is not ill-appearing.  HENT:     Head: Normocephalic.     Nose: Nose normal.  Cardiovascular:     Rate and Rhythm: Normal rate and regular rhythm.  Pulmonary:     Effort: Pulmonary effort is normal.     Breath sounds: No rhonchi.  Chest:     Chest wall: No tenderness.  Musculoskeletal:     Cervical back: Normal range of motion and neck supple.  Skin:    General: Skin is warm.  Neurological:     General: No focal deficit present.     Mental Status: He is oriented to person, place, and time.     Coordination: Coordination normal.     Gait: Gait normal.      UC Treatments / Results  Labs (all labs ordered are listed, but only abnormal results are displayed) Labs Reviewed  POCT FASTING CBG [facility] MANUAL ENTRY - Abnormal; Notable for the following components:      Result Value   POCT Glucose ([facility]) 226 (*)    All other components within normal limits  POCT URINALYSIS DIP (MANUAL ENTRY) - Abnormal; Notable for the following components:   Glucose, UA =250 (*)    Ketones, POC UA trace (5) (*)    All other components within normal limits    EKG SR, with left fascicular block, PR interval and QRS norma.  Radiology DG Chest 2 View  Result Date: 06/05/2020 CLINICAL DATA:  Shortness of breath, prior COVID in December 2020 EXAM: CHEST - 2 VIEW COMPARISON:  None. FINDINGS: The lungs are clear and negative  for focal airspace consolidation, pulmonary edema or suspicious pulmonary nodule. Minimal bronchitic changes, likely within expected limits for age. No pleural effusion or pneumothorax. Cardiac and mediastinal contours are within normal limits. No acute fracture or lytic or blastic osseous lesions. The visualized upper abdominal bowel gas pattern is unremarkable. IMPRESSION: No active cardiopulmonary disease. Electronically Signed   By: Jacqulynn Cadet M.D.   On: 06/05/2020 09:21    Procedures Procedures (including critical care time)  Medications Ordered in UC Medications - No data to display  Initial Impression / Assessment and Plan / UC Course  I have reviewed the triage vital signs and the nursing notes.  Pertinent labs & imaging results that were available during my care of the patient were reviewed by me and considered in my medical decision making (see chart for details).     Patient with a history of poorly controlled diabetes, obesity former smoker presents today with recent fatigue, weakness and shortness of breath.  Glucose is elevated on arrival today.  Patient endorses compliance with current medication regimen.  Provided information to get established with a PCP however increase glipizide to 15 mg/day in efforts to gain better control of diabetes.  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  Patient also given information to follow-up with primary care provider and get established as his diabetes and blood pressure warrants close monitoring.  Patient did verbalize understanding.  Benzonatate Perles sent over for management of chronic cough.  Advised patient if any chest  pain, worsening shortness of breath worsening weakness develops go immediately to the ER for emergent evaluation.  Patient verbalized understanding and agreement with plan.  Final Clinical Impressions(s) / UC Diagnoses   Final diagnoses:  Shortness of breath  Other fatigue  Uncontrolled type 2 diabetes mellitus with hyperglycemia (HCC)  Nonspecific abnormal electrocardiogram (ECG) (EKG)  Cough     Discharge Instructions     Schedule a new patient appointment at [facility] for further work-up of your symptoms.  I suspect symptoms are related to poorly controlled diabetes which exacerbating symptoms of fatigue. I am adding glipizide 5 mg to take at dinner with food. Continue glipizide 10 mg XL as prescribed.  I have placed a referral for you to be seen by cardiology given shortness of breath and palpations with two elevated high blood pressure readings, you need work -up to rule out any underlying heart disease as the source of your symptoms.  If any of your symptoms worsen or accompanied by chest pain or feeling that you are about to pass-out, go immediately to the emergency department.        ED Prescriptions    Medication Sig Dispense Auth. Provider   glipiZIDE (GLUCOTROL) 5 MG tablet Take 1 tablet (5 mg total) by mouth daily with supper. 30 tablet Scot Jun, FNP   benzonatate (TESSALON) 100 MG capsule Take 1 capsule (100 mg total) by mouth every 8 (eight) hours. 21 capsule Scot Jun, FNP     PDMP not reviewed this encounter.   Scot Jun, [place] 06/05/20 1042

## 2020-06-05 NOTE — Discharge Instructions (Signed)
Schedule a new patient appointment at [facility] for further work-up of your symptoms.  I suspect symptoms are related to poorly controlled diabetes which exacerbating symptoms of fatigue. I am adding glipizide 5 mg to take at dinner with food. Continue glipizide 10 mg XL as prescribed.  I have placed a referral for you to be seen by cardiology given shortness of breath and palpations with two elevated high blood pressure readings, you need work -up to rule out any underlying heart disease as the source of your symptoms.  If any of your symptoms worsen or accompanied by chest pain or feeling that you are about to pass-out, go immediately to the emergency department.

## 2020-06-17 ENCOUNTER — Ambulatory Visit (INDEPENDENT_AMBULATORY_CARE_PROVIDER_SITE_OTHER): Payer: BC Managed Care – PPO | Admitting: Medical-Surgical

## 2020-06-17 ENCOUNTER — Encounter: Payer: Self-pay | Admitting: Medical-Surgical

## 2020-06-17 ENCOUNTER — Other Ambulatory Visit: Payer: Self-pay

## 2020-06-17 VITALS — BP 143/81 | HR 74 | Temp 98.6°F | Ht 69.25 in | Wt 285.4 lb

## 2020-06-17 DIAGNOSIS — F419 Anxiety disorder, unspecified: Secondary | ICD-10-CM

## 2020-06-17 DIAGNOSIS — R030 Elevated blood-pressure reading, without diagnosis of hypertension: Secondary | ICD-10-CM

## 2020-06-17 DIAGNOSIS — Z7689 Persons encountering health services in other specified circumstances: Secondary | ICD-10-CM

## 2020-06-17 DIAGNOSIS — E1149 Type 2 diabetes mellitus with other diabetic neurological complication: Secondary | ICD-10-CM

## 2020-06-17 DIAGNOSIS — K219 Gastro-esophageal reflux disease without esophagitis: Secondary | ICD-10-CM

## 2020-06-17 LAB — COMPLETE METABOLIC PANEL WITH GFR
AG Ratio: 2 (calc) (ref 1.0–2.5)
ALT: 25 U/L (ref 9–46)
AST: 17 U/L (ref 10–35)
Albumin: 4.5 g/dL (ref 3.6–5.1)
Alkaline phosphatase (APISO): 91 U/L (ref 35–144)
BUN: 20 mg/dL (ref 7–25)
CO2: 27 mmol/L (ref 20–32)
Calcium: 9.7 mg/dL (ref 8.6–10.3)
Chloride: 101 mmol/L (ref 98–110)
Creat: 1.02 mg/dL (ref 0.70–1.25)
GFR, Est African American: 89 mL/min/{1.73_m2} (ref >=60)
GFR, Est Non African American: 77 mL/min/{1.73_m2} (ref >=60)
Globulin: 2.3 g/dL (calc) (ref 1.9–3.7)
Glucose, Bld: 215 mg/dL — ABNORMAL HIGH (ref 65–99)
Potassium: 4.2 mmol/L (ref 3.5–5.3)
Sodium: 137 mmol/L (ref 135–146)
Total Bilirubin: 0.5 mg/dL (ref 0.2–1.2)
Total Protein: 6.8 g/dL (ref 6.1–8.1)

## 2020-06-17 LAB — POCT UA - MICROALBUMIN
Creatinine, POC: 300 mg/dL
Microalbumin Ur, POC: 80 mg/L

## 2020-06-17 LAB — LIPID PANEL
Cholesterol: 103 mg/dL (ref <200)
HDL: 34 mg/dL — ABNORMAL LOW (ref >=40)
LDL Cholesterol (Calc): 52 mg/dL (calc)
Non-HDL Cholesterol (Calc): 69 mg/dL (calc) (ref <130)
Total CHOL/HDL Ratio: 3 (calc) (ref <5.0)
Triglycerides: 90 mg/dL (ref <150)

## 2020-06-17 LAB — POCT GLYCOSYLATED HEMOGLOBIN (HGB A1C): Hemoglobin A1C: 8.4 % — AB (ref 4.0–5.6)

## 2020-06-17 LAB — CBC
HCT: 48.8 % (ref 38.5–50.0)
Hemoglobin: 16.1 g/dL (ref 13.2–17.1)
MCH: 26.7 pg — ABNORMAL LOW (ref 27.0–33.0)
MCHC: 33 g/dL (ref 32.0–36.0)
MCV: 80.8 fL (ref 80.0–100.0)
MPV: 11.6 fL (ref 7.5–12.5)
Platelets: 266 10*3/uL (ref 140–400)
RBC: 6.04 10*6/uL — ABNORMAL HIGH (ref 4.20–5.80)
RDW: 13.4 % (ref 11.0–15.0)
WBC: 8.7 10*3/uL (ref 3.8–10.8)

## 2020-06-17 NOTE — Progress Notes (Signed)
New Patient Office Visit  Subjective:  Patient ID: Danny Morgan, male    DOB: 01-04-1956  Age: 64 y.o. MRN: [id number]  CC:  Chief Complaint  Patient presents with  . Establish Care  . Follow-up    [facility] on 06/05/2020  . Shortness of Breath    HPI Danny Morgan presents to establish care.   DM-diagnosed with diabetes in 2013.  At that time he was placed on Metformin 1000 mg twice daily with meals.  He is taking this regularly and tolerating well.  He is also taking glipizide 10 mg twice daily with meals.  He was recently prescribed an extra 5 mg of glipizide with his dinner dose but has not started this yet.  He only checks his sugars approximately once per month and that she usually when his toes are hurting especially bad.  He endorses some numbness and tingling to his bilateral feet that worsens when his sugars are high.  He also notes that he has had some vision changes and has been having to use readers for about 15 years.  His diet is not great, has lots of stressful family issues at home and his wife will not cook meals, preferring to eat out regularly.  No recent hemoglobin A1c completed.  HTN-has never been diagnosed with hypertension but his past 3 recorded blood pressures have been above goal.  Notes that at home he and his wife check the blood pressure manually but they also have an automatic machine.  Readings approximately 119/78.  He has been having palpitations lately and notes that there are times his pulse will skip a beat when checked manually.  Has had increasing shortness of breath since he had Covid and is unable to exercise.  Denies lower extremity edema and regular headaches but does have occasional dizzy spells.  Has an appointment with cardiology plan for 10/18.  Anxiety-has significant stress at home.  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  Notes that his family lives with them as well as their 2  children.  This is caused quite a bit of stress and anxiety in the home.  He uses Ativan very sparingly, only when stress/anxiety is severe, approximately 1-2 doses per month.  GERD-taking omeprazole daily, tolerating well.  Feels this manages his symptoms very well.  Past Medical History:  Diagnosis Date  . Allergy   . Anxiety   . Cataract    beginning stage in rt. eye  . Colon polyps   . Diabetes mellitus without complication ([place])   . GERD (gastroesophageal reflux disease)   . Hyperlipidemia     Past Surgical History:  Procedure Laterality Date  . COLONOSCOPY W/ BIOPSIES AND POLYPECTOMY    . RETINAL DETACHMENT SURGERY     age 25  . WISDOM TOOTH EXTRACTION      Family History  Problem Relation Age of Onset  . Parkinson's disease Father     Social History   Socioeconomic History  . Marital status: Single    Spouse name: Not on file  . Number of children: Not on file  . Years of education: Not on file  . Highest education level: Not on file  Occupational History  . Not on file  Tobacco Use  . Smoking status: Former Smoker    Types: Cigarettes  . Smokeless tobacco: Never Used  . Tobacco comment: quit at age 58  Vaping Use  . Vaping Use: Never used  Substance  and Sexual Activity  . Alcohol use: Yes    Comment: Occasionally  . Drug use: No  . Sexual activity: Not Currently  Other Topics Concern  . Not on file  Social History Narrative  . Not on file   Social Determinants of Health   Financial Resource Strain:   . Difficulty of Paying Living Expenses: Not on file  Food Insecurity:   . Worried About Charity fundraiser in the Last Year: Not on file  . Ran Out of Food in the Last Year: Not on file  Transportation Needs:   . Lack of Transportation (Medical): Not on file  . Lack of Transportation (Non-Medical): Not on file  Physical Activity:   . Days of Exercise per Week: Not on file  . Minutes of Exercise per Session: Not on file  Stress:   . Feeling of  Stress : Not on file  Social Connections:   . Frequency of Communication with Friends and Family: Not on file  . Frequency of Social Gatherings with Friends and Family: Not on file  . Attends Religious Services: Not on file  . Active Member of Clubs or Organizations: Not on file  . Attends Archivist Meetings: Not on file  . Marital Status: Not on file  Intimate Partner Violence:   . Fear of Current or Ex-Partner: Not on file  . Emotionally Abused: Not on file  . Physically Abused: Not on file  . Sexually Abused: Not on file    ROS Review of Systems  Constitutional: Positive for fatigue. Negative for chills, fever and unexpected weight change.  Eyes: Positive for visual disturbance.  Respiratory: Positive for shortness of breath. Negative for cough, chest tightness and wheezing.   Cardiovascular: Positive for palpitations. Negative for chest pain and leg swelling.  Neurological: Positive for dizziness and numbness. Negative for light-headedness and headaches.  Psychiatric/Behavioral: Positive for dysphoric mood. Negative for self-injury, sleep disturbance and suicidal ideas. The patient is nervous/anxious.     Objective:   Today's Vitals: BP (!) 143/81   Pulse 74   Temp 98.6 F (37 C) (Oral)   Ht 5' 9.25" (1.759 m)   Wt 285 lb 6.4 oz (129.5 kg)   SpO2 97%   BMI 41.84 kg/m   Physical Exam Vitals and nursing note reviewed.  Constitutional:      General: He is not in acute distress.    Appearance: Normal appearance. He is obese. He is not ill-appearing.  HENT:     Head: Normocephalic and atraumatic.  Cardiovascular:     Rate and Rhythm: Normal rate and regular rhythm.     Pulses: Normal pulses.     Heart sounds: Normal heart sounds. No murmur heard.  No friction rub. No gallop.   Pulmonary:     Effort: Pulmonary effort is normal. No respiratory distress.     Breath sounds: Normal breath sounds.  Abdominal:     General: Bowel sounds are normal.   Musculoskeletal:     Right lower leg: No edema.     Left lower leg: No edema.  Skin:    General: Skin is warm and dry.  Neurological:     Mental Status: He is alert and oriented to person, place, and time.  Psychiatric:        Mood and Affect: Mood is anxious.        Behavior: Behavior normal.        Thought Content: Thought content normal.  Judgment: Judgment normal.     Assessment & Plan:   1. Encounter to establish care Reviewed available information discussed health care concerns with patient.  2. 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(HCC) POCT hemoglobin A1c 8.4%.  POCT microalbumin abnormal.  Checking CBC, CMP, and lipid panel today.  Continue Metformin 1000 mg twice daily with meals.  Continue glipizide 10 mg twice daily.  Advised patient to check blood sugars every morning, preferably fasting.  Diabetic nutrition information provided with AVS.  As he is concerned with his weight and we need better glucose control, discussed possibly starting Ozempic.  He will think on this and decide if this is something he would like to do. - CBC - COMPLETE METABOLIC PANEL WITH GFR - Lipid panel - POCT UA - Microalbumin - POCT glycosylated hemoglobin (Hb A1C)  3. Elevated blood pressure reading in office without diagnosis of hypertension Checking labs today.  Advised patient to check his blood pressure at home and keep a log to take with him to his cardiology appointment on 10/18.  If his blood pressure is still elevated at that time they will at least have home readings to compare to.  With his diagnosis of diabetes, it would be beneficial to have him on an ACE/ARB for its renal protective nature. - CBC - COMPLETE METABOLIC PANEL WITH GFR - Lipid panel  4. Gastroesophageal reflux disease without esophagitis Continue omeprazole.  Avoid foods and activities that worsen GERD.  5. Anxiety Discussed anxiety management.  Okay to continue very sparing use of  lorazepam for now.  Outpatient Encounter Medications as of 06/17/2020  Medication Sig  . atorvastatin (LIPITOR) 40 MG tablet Take 1 tablet by mouth daily.  . benzonatate (TESSALON) 100 MG capsule Take 1 capsule (100 mg total) by mouth every 8 (eight) hours. (Patient taking differently: Take 100 mg by mouth every 8 (eight) hours as needed. )  . Cholecalciferol (VITAMIN D PO) Take by mouth daily.  . diphenhydrAMINE (BENADRYL) 25 MG tablet Take 25 mg by mouth daily.  Marland Kitchen glipiZIDE (GLUCOTROL XL) 10 MG 24 hr tablet Take 10 mg by mouth 2 (two) times daily.   Marland Kitchen ibuprofen (ADVIL,MOTRIN) 600 MG tablet 1 TABLET EVERY 6-8 HOURS BY MOUTH 07 DAYS  . loratadine (CLARITIN) 10 MG tablet Take 1 tablet by mouth daily.   Marland Kitchen LORazepam (ATIVAN) 1 MG tablet Take 1 mg by mouth daily as needed for anxiety.   . metFORMIN (GLUCOPHAGE) 1000 MG tablet 1 TABLET WITH A MEAL 2 TIMES A DAY ORALLY 90 DAYS  . omeprazole (PRILOSEC) 10 MG capsule Take 10 mg by mouth daily.   Marland Kitchen glipiZIDE (GLUCOTROL) 5 MG tablet Take 1 tablet (5 mg total) by mouth daily with supper. (Patient not taking: Reported on 06/17/2020)  . [DISCONTINUED] Cetirizine-Pseudoephedrine (ZYRTEC-D PO) Take by mouth daily. (Patient not taking: Reported on 06/17/2020)  . [DISCONTINUED] diclofenac sodium (VOLTAREN) 1 % GEL AS DIRECTED EVERY 6-8 HOURS EXTERNALLY 14 DAYS (Patient not taking: Reported on 06/17/2020)   Facility-Administered Encounter Medications as of 06/17/2020  Medication  . 0.9 %  sodium chloride infusion    Follow-up: Return in about 3 months (around 09/17/2020) for DM/HTN follow up.   Clearnce Sorrel, DNP, APRN, FNP-BC [facility]

## 2020-06-17 NOTE — Patient Instructions (Signed)
Diabetes Mellitus and Nutrition, Adult When you have diabetes (diabetes mellitus), it is very important to have healthy eating habits because your blood sugar (glucose) levels are greatly affected by what you eat and drink. Eating healthy foods in the appropriate amounts, at about the same times every day, can help you:  Control your blood glucose.  Lower your risk of heart disease.  Improve your blood pressure.  Reach or maintain a healthy weight. Every person with diabetes is different, and each person has different needs for a meal plan. Your health care provider may recommend that you work with a diet and nutrition specialist (dietitian) to make a meal plan that is best for you. Your meal plan may vary depending on factors such as:  The calories you need.  The medicines you take.  Your weight.  Your blood glucose, blood pressure, and cholesterol levels.  Your activity level.  Other health conditions you have, such as heart or kidney disease. How do carbohydrates affect me? Carbohydrates, also called carbs, affect your blood glucose level more than any other type of food. Eating carbs naturally raises the amount of glucose in your blood. Carb counting is a method for keeping track of how many carbs you eat. Counting carbs is important to keep your blood glucose at a healthy level, especially if you use insulin or take certain oral diabetes medicines. It is important to know how many carbs you can safely have in each meal. This is different for every person. Your dietitian can help you calculate how many carbs you should have at each meal and for each snack. Foods that contain carbs include:  Bread, cereal, rice, pasta, and crackers.  Potatoes and corn.  Peas, beans, and lentils.  Milk and yogurt. Fruit and juice.  Desserts, such as cakes, cookies, ice cream, and candy. How does alcohol affect me? Alcohol can cause a sudden decrease in blood glucose (hypoglycemia), especially  if you use insulin or take certain oral diabetes medicines. Hypoglycemia can be a life-threatening condition. Symptoms of hypoglycemia (sleepiness, dizziness, and confusion) are similar to symptoms of having too much alcohol. If your health care provider says that alcohol is safe for you, follow these guidelines:  Limit alcohol intake to no more than 1 drink per day for nonpregnant women and 2 drinks per day for men. One drink equals 12 oz of beer, 5 oz of wine, or 1 oz of hard liquor.  Do not drink on an empty stomach.  Keep yourself hydrated with water, diet soda, or unsweetened iced tea.  Keep in mind that regular soda, juice, and other mixers may contain a lot of sugar and must be counted as carbs. What are tips for following this plan?  Reading food labels  Start by checking the serving size on the "Nutrition Facts" label of packaged foods and drinks. The amount of calories, carbs, fats, and other nutrients listed on the label is based on one serving of the item. Many items contain more than one serving per package.  Check the total grams (g) of carbs in one serving. You can calculate the number of servings of carbs in one serving by dividing the total carbs by 15. For example, if a food has 30 g of total carbs, it would be equal to 2 servings of carbs.  Check the number of grams (g) of saturated and trans fats in one serving. Choose foods that have low or no amount of these fats.  Check the number of milligrams (  mg) of salt (sodium) in one serving. Most people should limit total sodium intake to less than 2,300 mg per day.  Always check the nutrition information of foods labeled as "low-fat" or "nonfat". These foods may be higher in added sugar or refined carbs and should be avoided.  Talk to your dietitian to identify your daily goals for nutrients listed on the label. Shopping  Avoid buying canned, premade, or processed foods. These foods tend to be high in fat, sodium, and added  sugar.  Shop around the outside edge of the grocery store. This includes fresh fruits and vegetables, bulk grains, fresh meats, and fresh dairy. Cooking  Use low-heat cooking methods, such as baking, instead of high-heat cooking methods like deep frying.  Cook using healthy oils, such as olive, canola, or sunflower oil.  Avoid cooking with butter, cream, or high-fat meats. Meal planning  Eat meals and snacks regularly, preferably at the same times every day. Avoid going long periods of time without eating.  Eat foods high in fiber, such as fresh fruits, vegetables, beans, and whole grains. Talk to your dietitian about how many servings of carbs you can eat at each meal.  Eat 4-6 ounces (oz) of lean protein each day, such as lean meat, chicken, fish, eggs, or tofu. One oz of lean protein is equal to: ? 1 oz of meat, chicken, or fish. ? 1 egg. ?  cup of tofu.  Eat some foods each day that contain healthy fats, such as avocado, nuts, seeds, and fish. Lifestyle  Check your blood glucose regularly.  Exercise regularly as told by your health care provider. This may include: ? 150 minutes of moderate-intensity or vigorous-intensity exercise each week. This could be brisk walking, biking, or water aerobics. ? Stretching and doing strength exercises, such as yoga or weightlifting, at least 2 times a week.  Take medicines as told by your health care provider.  Do not use any products that contain nicotine or tobacco, such as cigarettes and e-cigarettes. If you need help quitting, ask your health care provider.  Work with a counselor or diabetes educator to identify strategies to manage stress and any emotional and social challenges. Questions to ask a health care provider  Do I need to meet with a diabetes educator?  Do I need to meet with a dietitian?  What number can I call if I have questions?  When are the best times to check my blood glucose? Where to find more  information:  American Diabetes Association: diabetes.org  Academy of Nutrition and Dietetics: www.eatright.org  National Institute of Diabetes and Digestive and Kidney Diseases (NIH): www.niddk.nih.gov Summary  A healthy meal plan will help you control your blood glucose and maintain a healthy lifestyle.  Working with a diet and nutrition specialist (dietitian) can help you make a meal plan that is best for you.  Keep in mind that carbohydrates (carbs) and alcohol have immediate effects on your blood glucose levels. It is important to count carbs and to use alcohol carefully. This information is not intended to replace advice given to you by your health care provider. Make sure you discuss any questions you have with your health care provider. Document Revised: 08/06/2017 Document Reviewed: 09/28/2016 Elsevier Patient Education  2020 Elsevier Inc.  DASH Eating Plan DASH stands for "Dietary Approaches to Stop Hypertension." The DASH eating plan is a healthy eating plan that has been shown to reduce high blood pressure (hypertension). It may also reduce your risk for type 2   diabetes, heart disease, and stroke. The DASH eating plan may also help with weight loss. What are tips for following this plan?  General guidelines  Avoid eating more than 2,300 mg (milligrams) of salt (sodium) a day. If you have hypertension, you may need to reduce your sodium intake to 1,500 mg a day.  Limit alcohol intake to no more than 1 drink a day for nonpregnant women and 2 drinks a day for men. One drink equals 12 oz of beer, 5 oz of wine, or 1 oz of hard liquor.  Work with your health care provider to maintain a healthy body weight or to lose weight. Ask what an ideal weight is for you.  Get at least 30 minutes of exercise that causes your heart to beat faster (aerobic exercise) most days of the week. Activities may include walking, swimming, or biking.  Work with your health care provider or diet and  nutrition specialist (dietitian) to adjust your eating plan to your individual calorie needs. Reading food labels   Check food labels for the amount of sodium per serving. Choose foods with less than 5 percent of the Daily Value of sodium. Generally, foods with less than 300 mg of sodium per serving fit into this eating plan.  To find whole grains, look for the word "whole" as the first word in the ingredient list. Shopping  Buy products labeled as "low-sodium" or "no salt added."  Buy fresh foods. Avoid canned foods and premade or frozen meals. Cooking  Avoid adding salt when cooking. Use salt-free seasonings or herbs instead of table salt or sea salt. Check with your health care provider or pharmacist before using salt substitutes.  Do not fry foods. Cook foods using healthy methods such as baking, boiling, grilling, and broiling instead.  Cook with heart-healthy oils, such as olive, canola, soybean, or sunflower oil. Meal planning  Eat a balanced diet that includes: ? 5 or more servings of fruits and vegetables each day. At each meal, try to fill half of your plate with fruits and vegetables. ? Up to 6-8 servings of whole grains each day. ? Less than 6 oz of lean meat, poultry, or fish each day. A 3-oz serving of meat is about the same size as a deck of cards. One egg equals 1 oz. ? 2 servings of low-fat dairy each day. ? A serving of nuts, seeds, or beans 5 times each week. ? Heart-healthy fats. Healthy fats called Omega-3 fatty acids are found in foods such as flaxseeds and coldwater fish, like sardines, salmon, and mackerel.  Limit how much you eat of the following: ? Canned or prepackaged foods. ? Food that is high in trans fat, such as fried foods. ? Food that is high in saturated fat, such as fatty meat. ? Sweets, desserts, sugary drinks, and other foods with added sugar. ? Full-fat dairy products.  Do not salt foods before eating.  Try to eat at least 2 vegetarian  meals each week.  Eat more home-cooked food and less restaurant, buffet, and fast food.  When eating at a restaurant, ask that your food be prepared with less salt or no salt, if possible. What foods are recommended? The items listed may not be a complete list. Talk with your dietitian about what dietary choices are best for you. Grains Whole-grain or whole-wheat bread. Whole-grain or whole-wheat pasta. Brown rice. Oatmeal. Quinoa. Bulgur. Whole-grain and low-sodium cereals. Pita bread. Low-fat, low-sodium crackers. Whole-wheat flour tortillas. Vegetables Fresh or frozen vegetables (  raw, steamed, roasted, or grilled). Low-sodium or reduced-sodium tomato and vegetable juice. Low-sodium or reduced-sodium tomato sauce and tomato paste. Low-sodium or reduced-sodium canned vegetables. Fruits All fresh, dried, or frozen fruit. Canned fruit in natural juice (without added sugar). Meat and other protein foods Skinless chicken or turkey. Ground chicken or turkey. Pork with fat trimmed off. Fish and seafood. Egg whites. Dried beans, peas, or lentils. Unsalted nuts, nut butters, and seeds. Unsalted canned beans. Lean cuts of beef with fat trimmed off. Low-sodium, lean deli meat. Dairy Low-fat (1%) or fat-free (skim) milk. Fat-free, low-fat, or reduced-fat cheeses. Nonfat, low-sodium ricotta or cottage cheese. Low-fat or nonfat yogurt. Low-fat, low-sodium cheese. Fats and oils Soft margarine without trans fats. Vegetable oil. Low-fat, reduced-fat, or light mayonnaise and salad dressings (reduced-sodium). Canola, safflower, olive, soybean, and sunflower oils. Avocado. Seasoning and other foods Herbs. Spices. Seasoning mixes without salt. Unsalted popcorn and pretzels. Fat-free sweets. What foods are not recommended? The items listed may not be a complete list. Talk with your dietitian about what dietary choices are best for you. Grains Baked goods made with fat, such as croissants, muffins, or some  breads. Dry pasta or rice meal packs. Vegetables Creamed or fried vegetables. Vegetables in a cheese sauce. Regular canned vegetables (not low-sodium or reduced-sodium). Regular canned tomato sauce and paste (not low-sodium or reduced-sodium). Regular tomato and vegetable juice (not low-sodium or reduced-sodium). Pickles. Olives. Fruits Canned fruit in a light or heavy syrup. Fried fruit. Fruit in cream or butter sauce. Meat and other protein foods Fatty cuts of meat. Ribs. Fried meat. Bacon. Sausage. Bologna and other processed lunch meats. Salami. Fatback. Hotdogs. Bratwurst. Salted nuts and seeds. Canned beans with added salt. Canned or smoked fish. Whole eggs or egg yolks. Chicken or turkey with skin. Dairy Whole or 2% milk, cream, and half-and-half. Whole or full-fat cream cheese. Whole-fat or sweetened yogurt. Full-fat cheese. Nondairy creamers. Whipped toppings. Processed cheese and cheese spreads. Fats and oils Butter. Stick margarine. Lard. Shortening. Ghee. Bacon fat. Tropical oils, such as coconut, palm kernel, or palm oil. Seasoning and other foods Salted popcorn and pretzels. Onion salt, garlic salt, seasoned salt, table salt, and sea salt. Worcestershire sauce. Tartar sauce. Barbecue sauce. Teriyaki sauce. Soy sauce, including reduced-sodium. Steak sauce. Canned and packaged gravies. Fish sauce. Oyster sauce. Cocktail sauce. Horseradish that you find on the shelf. Ketchup. Mustard. Meat flavorings and tenderizers. Bouillon cubes. Hot sauce and Tabasco sauce. Premade or packaged marinades. Premade or packaged taco seasonings. Relishes. Regular salad dressings. Where to find more information:  National Heart, Lung, and Blood Institute: www.nhlbi.nih.gov  American Heart Association: www.heart.org Summary  The DASH eating plan is a healthy eating plan that has been shown to reduce high blood pressure (hypertension). It may also reduce your risk for type 2 diabetes, heart disease, and  stroke.  With the DASH eating plan, you should limit salt (sodium) intake to 2,300 mg a day. If you have hypertension, you may need to reduce your sodium intake to 1,500 mg a day.  When on the DASH eating plan, aim to eat more fresh fruits and vegetables, whole grains, lean proteins, low-fat dairy, and heart-healthy fats.  Work with your health care provider or diet and nutrition specialist (dietitian) to adjust your eating plan to your individual calorie needs. This information is not intended to replace advice given to you by your health care provider. Make sure you discuss any questions you have with your health care provider. Document Revised: 08/06/2017 Document Reviewed: 08/17/2016   Elsevier Patient Education  2020 Elsevier Inc.   

## 2020-06-19 ENCOUNTER — Other Ambulatory Visit: Payer: Self-pay

## 2020-06-19 ENCOUNTER — Other Ambulatory Visit: Payer: Self-pay | Admitting: Medical-Surgical

## 2020-06-19 DIAGNOSIS — E1149 Type 2 diabetes mellitus with other diabetic neurological complication: Secondary | ICD-10-CM

## 2020-06-19 MED ORDER — LORAZEPAM 1 MG PO TABS: 1.0000 mg | ORAL_TABLET | Freq: Every day | ORAL | 0 refills | Status: DC | PRN

## 2020-06-19 MED ORDER — SEMAGLUTIDE(0.25 OR 0.5MG/DOS) 2 MG/1.5ML ~~LOC~~ SOPN: PEN_INJECTOR | SUBCUTANEOUS | 0 refills | Status: DC

## 2020-06-19 NOTE — Telephone Encounter (Addendum)
Patient aware via voicemail that the Rx has been sent to the pharmacy for him.

## 2020-06-19 NOTE — Telephone Encounter (Signed)
Pt is requesting a refill of lorazepam 1 mg. We have never written this for him before. Rx has been tee'd up below with the sig being what he reported to [place] at his last visit. Rx is ready for review and approval/denial.

## 2020-06-21 DIAGNOSIS — K219 Gastro-esophageal reflux disease without esophagitis: Secondary | ICD-10-CM | POA: Insufficient documentation

## 2020-06-21 DIAGNOSIS — E785 Hyperlipidemia, unspecified: Secondary | ICD-10-CM | POA: Insufficient documentation

## 2020-06-21 DIAGNOSIS — T7840XA Allergy, unspecified, initial encounter: Secondary | ICD-10-CM | POA: Insufficient documentation

## 2020-06-21 DIAGNOSIS — E1149 Type 2 diabetes mellitus with other diabetic neurological complication: Secondary | ICD-10-CM | POA: Insufficient documentation

## 2020-06-21 DIAGNOSIS — H269 Unspecified cataract: Secondary | ICD-10-CM | POA: Insufficient documentation

## 2020-06-21 DIAGNOSIS — E119 Type 2 diabetes mellitus without complications: Secondary | ICD-10-CM | POA: Insufficient documentation

## 2020-06-21 DIAGNOSIS — F419 Anxiety disorder, unspecified: Secondary | ICD-10-CM | POA: Insufficient documentation

## 2020-06-21 DIAGNOSIS — K635 Polyp of colon: Secondary | ICD-10-CM | POA: Insufficient documentation

## 2020-06-24 ENCOUNTER — Ambulatory Visit (INDEPENDENT_AMBULATORY_CARE_PROVIDER_SITE_OTHER): Payer: BC Managed Care – PPO | Admitting: Cardiology

## 2020-06-24 ENCOUNTER — Encounter: Payer: Self-pay | Admitting: Cardiology

## 2020-06-24 ENCOUNTER — Other Ambulatory Visit: Payer: Self-pay

## 2020-06-24 VITALS — BP 138/86 | HR 73 | Ht 71.0 in | Wt 286.1 lb

## 2020-06-24 DIAGNOSIS — E119 Type 2 diabetes mellitus without complications: Secondary | ICD-10-CM

## 2020-06-24 DIAGNOSIS — R0600 Dyspnea, unspecified: Secondary | ICD-10-CM | POA: Diagnosis not present

## 2020-06-24 DIAGNOSIS — R0609 Other forms of dyspnea: Secondary | ICD-10-CM

## 2020-06-24 DIAGNOSIS — R072 Precordial pain: Secondary | ICD-10-CM

## 2020-06-24 DIAGNOSIS — E782 Mixed hyperlipidemia: Secondary | ICD-10-CM

## 2020-06-24 DIAGNOSIS — R0789 Other chest pain: Secondary | ICD-10-CM

## 2020-06-24 DIAGNOSIS — R011 Cardiac murmur, unspecified: Secondary | ICD-10-CM

## 2020-06-24 HISTORY — DX: Cardiac murmur, unspecified: R01.1

## 2020-06-24 HISTORY — DX: Other forms of dyspnea: R06.09

## 2020-06-24 HISTORY — DX: Other chest pain: R07.89

## 2020-06-24 HISTORY — DX: Dyspnea, unspecified: R06.00

## 2020-06-24 HISTORY — DX: Morbid (severe) obesity due to excess calories: E66.01

## 2020-06-24 MED ORDER — ASPIRIN EC 81 MG PO TBEC
81.0000 mg | DELAYED_RELEASE_TABLET | Freq: Every day | ORAL | 3 refills | Status: DC
Start: 2020-06-24 — End: 2020-08-13

## 2020-06-24 NOTE — Progress Notes (Signed)
Cardiology Office Note:    Date:  06/24/2020   ID:  Danny Morgan. Danny Morgan., DOB 06-14-1956, MRN [id number]  PCP:  Samuel Bouche, NP  Cardiologist:  Jenean Lindau, MD   Referring MD: Scot Jun, FNP    ASSESSMENT:    1. Diabetes mellitus without complication ([place])   2. DOE (dyspnea on exertion)   3. Chest discomfort   4. Mixed hyperlipidemia   5. Morbid obesity ([place])   6. Cardiac murmur    PLAN:    In order of problems listed above:  1. Primary prevention stressed with patient.  Importance of compliance with diet medication stressed any vocalized understanding. 2. Mixed dyslipidemia: Diet was emphasized.  Weight reduction was stressed.  Lipids were reviewed.  He is on statin therapy. 3. Diabetes mellitus: Elevated hemoglobin A1c.  Diet was emphasized weight reduction was stressed.  He promises to do better.  This is followed by his primary care doctor. 4. Dyspnea on exertion: Patient has multiple risk factors for coronary artery disease and therefore I recommended Lexiscan sestamibi and he is agreeable. 5. Morbid obesity: Diet was emphasized.  Risks of obesity explained to the patient and he promises to do better. 6. I also feel that he should be on a low-dose ACE inhibitor in view of the fact that he is diabetic but this I will leave to the care of his primary care physician. 7. He was advised to take a coated 81 mg aspirin daily basis. 8. Patient will be seen in follow-up appointment in 6 months or earlier if the patient has any concerns 9. Cardiac murmur: Echocardiogram will be done to assess murmur heard on auscultation.   Medication Adjustments/Labs and Tests Ordered: Current medicines are reviewed at length with the patient today.  Concerns regarding medicines are outlined above.  No orders of the defined types were placed in this encounter.  No orders of the defined types were placed in this encounter.    History of Present Illness:    Danny Morgan. Danny Morgan. is a 64 y.o. male who is being seen today for the evaluation of dyspnea on exertion chest discomfort at the request of Scot Jun, FNP.  Patient is a pleasant 64 year old male.  He has past medical history of mixed dyslipidemia and diabetes mellitus.  He is morbidly obese.  He leads a fairly sedentary lifestyle.  He has had COVID-19 pneumonia.  Patient mentions to me that upon exertion he has shortness of breath.  No orthopnea or PND.  He occasionally experiences chest tightness.  This is not related to exertion.  At the time of my evaluation, the patient is alert awake oriented and in no distress.  Past Medical History:  Diagnosis Date  . Allergy   . Anxiety   . Cataract    beginning stage in rt. eye  . Colon polyps   . Diabetes mellitus without complication ([place])   . GERD (gastroesophageal reflux disease)   . Hyperlipidemia     Past Surgical History:  Procedure Laterality Date  . COLONOSCOPY W/ BIOPSIES AND POLYPECTOMY    . RETINAL DETACHMENT SURGERY     age 60  . WISDOM TOOTH EXTRACTION      Current Medications: Current Meds  Medication Sig  . atorvastatin (LIPITOR) 40 MG tablet Take 1 tablet by mouth daily.  . Cholecalciferol (VITAMIN D PO) Take by mouth daily.  . diphenhydrAMINE (BENADRYL) 25 MG tablet Take 25 mg by mouth daily.  Marland Kitchen glipiZIDE (GLUCOTROL) 5  MG tablet Take 1 tablet (5 mg total) by mouth daily with supper. (Patient taking differently: Take 5 mg by mouth 2 (two) times daily. )  . loratadine (CLARITIN) 10 MG tablet Take 1 tablet by mouth daily.   Marland Kitchen LORazepam (ATIVAN) 1 MG tablet Take 1 tablet (1 mg total) by mouth daily as needed for anxiety.  Marland Kitchen omeprazole (PRILOSEC) 10 MG capsule Take 10 mg by mouth daily.   . [DISCONTINUED] benzonatate (TESSALON) 100 MG capsule Take 1 capsule (100 mg total) by mouth every 8 (eight) hours. (Patient taking differently: Take 100 mg by mouth every 8 (eight) hours as needed. )   Current Facility-Administered Medications for  the 06/24/20 encounter (Office Visit) with Jameria Bradway, Reita Cliche, MD  Medication  . 0.9 %  sodium chloride infusion     Allergies:   Penicillins   Social History   Socioeconomic History  . Marital status: Married    Spouse name: Not on file  . Number of children: Not on file  . Years of education: Not on file  . Highest education level: Not on file  Occupational History  . Not on file  Tobacco Use  . Smoking status: Former Smoker    Types: Cigarettes  . Smokeless tobacco: Never Used  . Tobacco comment: quit at age 86  Vaping Use  . Vaping Use: Never used  Substance and Sexual Activity  . Alcohol use: Yes    Comment: Occasionally  . Drug use: No  . Sexual activity: Not Currently  Other Topics Concern  . Not on file  Social History Narrative  . Not on file   Social Determinants of Health   Financial Resource Strain:   . Difficulty of Paying Living Expenses: Not on file  Food Insecurity:   . Worried About Charity fundraiser in the Last Year: Not on file  . Ran Out of Food in the Last Year: Not on file  Transportation Needs:   . Lack of Transportation (Medical): Not on file  . Lack of Transportation (Non-Medical): Not on file  Physical Activity:   . Days of Exercise per Week: Not on file  . Minutes of Exercise per Session: Not on file  Stress:   . Feeling of Stress : Not on file  Social Connections:   . Frequency of Communication with Friends and Family: Not on file  . Frequency of Social Gatherings with Friends and Family: Not on file  . Attends Religious Services: Not on file  . Active Member of Clubs or Organizations: Not on file  . Attends Archivist Meetings: Not on file  . Marital Status: Not on file     Family History: The patient's family history includes Parkinson's disease in his father.  ROS:   Please see the history of present illness.    All other systems reviewed and are negative.  EKGs/Labs/Other Studies Reviewed:    The following  studies were reviewed today: EKG reveals sinus rhythm and nonspecific ST-T changes.  Incomplete right bundle branch block and left anterior fascicular block.   Recent Labs: 06/17/2020: ALT 25; BUN 20; Creat 1.02; Hemoglobin 16.1; Platelets 266; Potassium 4.2; Sodium 137  Recent Lipid Panel    Component Value Date/Time   CHOL 103 06/17/2020 1108   TRIG 90 06/17/2020 1108   HDL 34 (L) 06/17/2020 1108   CHOLHDL 3.0 06/17/2020 1108   LDLCALC 52 06/17/2020 1108    Physical Exam:    VS:  BP 138/86   Pulse  73   Ht 5\' 11"  (1.803 m)   Wt 286 lb 1.3 oz (129.8 kg)   SpO2 98%   BMI 39.90 kg/m     Wt Readings from Last 3 Encounters:  06/24/20 286 lb 1.3 oz (129.8 kg)  06/17/20 285 lb 6.4 oz (129.5 kg)  06/05/20 290 lb (131.5 kg)     GEN: Patient is in no acute distress HEENT: Normal NECK: No JVD; No carotid bruits LYMPHATICS: No lymphadenopathy CARDIAC: S1 S2 regular, 2/6 systolic murmur at the apex. RESPIRATORY:  Clear to auscultation without rales, wheezing or rhonchi  ABDOMEN: Soft, non-tender, non-distended MUSCULOSKELETAL:  No edema; No deformity  SKIN: Warm and dry NEUROLOGIC:  Alert and oriented x 3 PSYCHIATRIC:  Normal affect    Signed, Jenean Lindau, MD  06/24/2020 10:26 AM    [facility]

## 2020-06-24 NOTE — Addendum Note (Signed)
Addended by: Truddie Hidden on: 06/24/2020 10:40 AM   Modules accepted: Orders

## 2020-06-24 NOTE — Patient Instructions (Addendum)
Medication Instructions:  Your physician has recommended you make the following change in your medication:   Take 81 mg coated Aspirin daily.  *If you need a refill on your cardiac medications before your next appointment, please call your pharmacy*   Lab Work: None ordered If you have labs (blood work) drawn today and your tests are completely normal, you will receive your results only by: Marland Kitchen MyChart Message (if you have MyChart) OR . A paper copy in the mail If you have any lab test that is abnormal or we need to change your treatment, we will call you to review the results.   Testing/Procedures: Your physician has requested that you have an echocardiogram. Echocardiography is a painless test that uses sound waves to create images of your heart. It provides your doctor with information about the size and shape of your heart and how well your heart's chambers and valves are working. This procedure takes approximately one hour. There are no restrictions for this procedure.  Your physician has requested that you have a lexiscan myoview. For further information please visit [email or web address]. Please follow instruction sheet, as given.  The test will take2 days and  approximately 3 to 4 hours each day to complete; you may bring reading material.  If someone comes with you to your appointment, they will need to remain in the main lobby due to limited space in the testing area.   How to prepare for your Myocardial Perfusion Test: . Do not eat or drink 3 hours prior to your test, except you may have water. . Do not consume products containing caffeine (regular or decaffeinated) 12 hours prior to your test. (ex: coffee, chocolate, sodas, tea). . Do bring a list of your current medications with you.  If not listed below, you may take your medications as normal. . Do wear comfortable clothes (no dresses or overalls) and walking shoes, tennis shoes preferred (No heels or open toe shoes are  allowed). . Do NOT wear cologne, perfume, aftershave, or lotions (deodorant is allowed). . If these instructions are not followed, your test will have to be rescheduled.    Follow-Up: At [facility], you and your health needs are our priority.  As part of our continuing mission to provide you with exceptional heart care, we have created designated Provider Care Teams.  These Care Teams include your primary Cardiologist (physician) and Advanced Practice Providers (APPs -  Physician Assistants and Nurse Practitioners) who all work together to provide you with the care you need, when you need it.  We recommend signing up for the patient portal called "MyChart".  Sign up information is provided on this After Visit Summary.  MyChart is used to connect with patients for Virtual Visits (Telemedicine).  Patients are able to view lab/test results, encounter notes, upcoming appointments, etc.  Non-urgent messages can be sent to your provider as well.   To learn more about what you can do with MyChart, go to [email or web address].    Your next appointment:   2 month(s)  The format for your next appointment:   In Person  Provider:   Jyl Heinz, MD   Other Instructions  Cardiac Nuclear Scan  A cardiac nuclear scan is a test that is done to check the flow of blood to your heart. It is done when you are resting and when you are exercising. The test looks for problems such as:  Not enough blood reaching a portion of the heart.  The heart muscle not working  as it should. You may need this test if:  You have heart disease.  You have had lab results that are not normal.  You have had heart surgery or a balloon procedure to open up blocked arteries (angioplasty).  You have chest pain.  You have shortness of breath. In this test, a special dye (tracer) is put into your bloodstream. The tracer will travel to your heart. A camera will then take pictures of your heart to see how the tracer  moves through your heart. This test is usually done at a hospital and takes 2-4 hours. Tell a doctor about:  Any allergies you have.  All medicines you are taking, including vitamins, herbs, eye drops, creams, and over-the-counter medicines.  Any problems you or family members have had with anesthetic medicines.  Any blood disorders you have.  Any surgeries you have had.  Any medical conditions you have.  Whether you are pregnant or may be pregnant. What are the risks? Generally, this is a safe test. However, problems may occur, such as:  Serious chest pain and heart attack. This is only a risk if the stress portion of the test is done.  Rapid heartbeat.  A feeling of warmth in your chest. This feeling usually does not last long.  Allergic reaction to the tracer. What happens before the test?  Ask your doctor about changing or stopping your normal medicines. This is important.  Follow instructions from your doctor about what you cannot eat or drink.  Remove your jewelry on the day of the test. What happens during the test? 1. An IV tube will be inserted into one of your veins. 2. Your doctor will give you a small amount of tracer through the IV tube. 3. You will wait for 20-40 minutes while the tracer moves through your bloodstream. 4. Your heart will be monitored with an electrocardiogram (ECG). 5. You will lie down on an exam table. 6. Pictures of your heart will be taken for about 15-20 minutes. 7. You may also have a stress test. For this test, one of these things may be done: ? You will be asked to exercise on a treadmill or a stationary bike. ? You will be given medicines that will make your heart work harder. This is done if you are unable to exercise. 8. When blood flow to your heart has peaked, a tracer will again be given through the IV tube. 9. After 20-40 minutes, you will get back on the exam table. More pictures will be taken of your heart. 10. Depending on  the tracer that is used, more pictures may need to be taken 3-4 hours later. 11. Your IV tube will be removed when the test is over. The test may vary among doctors and hospitals. What happens after the test? 1. Ask your doctor: ? Whether you can return to your normal schedule, including diet, activities, and medicines. ? Whether you should drink more fluids. This will help to remove the tracer from your body. Drink enough fluid to keep your pee (urine) pale yellow. 2. Ask your doctor, or the department that is doing the test: ? When will my results be ready? ? How will I get my results? Summary  A cardiac nuclear scan is a test that is done to check the flow of blood to your heart.  Tell your doctor whether you are pregnant or may be pregnant.  Before the test, ask your doctor about changing or stopping your normal medicines. This  is important.  Ask your doctor whether you can return to your normal activities. You may be asked to drink more fluids. This information is not intended to replace advice given to you by your health care provider. Make sure you discuss any questions you have with your health care provider. Document Revised: 12/14/2018 Document Reviewed: 02/07/2018 Elsevier Patient Education  [place].  Echocardiogram An echocardiogram is a procedure that uses painless sound waves (ultrasound) to produce an image of the heart. Images from an echocardiogram can provide important information about:  Signs of coronary artery disease (CAD).  Aneurysm detection. An aneurysm is a weak or damaged part of an artery wall that bulges out from the normal force of blood pumping through the body.  Heart size and shape. Changes in the size or shape of the heart can be associated with certain conditions, including heart failure, aneurysm, and CAD.  Heart muscle function.  Heart valve function.  Signs of a past heart attack.  Fluid buildup around the heart.  Thickening of  the heart muscle.  A tumor or infectious growth around the heart valves. Tell a health care provider about:  Any allergies you have.  All medicines you are taking, including vitamins, herbs, eye drops, creams, and over-the-counter medicines.  Any blood disorders you have.  Any surgeries you have had.  Any medical conditions you have.  Whether you are pregnant or may be pregnant. What are the risks? Generally, this is a safe procedure. However, problems may occur, including:  Allergic reaction to dye (contrast) that may be used during the procedure. What happens before the procedure? No specific preparation is needed. You may eat and drink normally. What happens during the procedure?    An IV tube may be inserted into one of your veins.  You may receive contrast through this tube. A contrast is an injection that improves the quality of the pictures from your heart.  A gel will be applied to your chest.  A wand-like tool (transducer) will be moved over your chest. The gel will help to transmit the sound waves from the transducer.  The sound waves will harmlessly bounce off of your heart to allow the heart images to be captured in real-time motion. The images will be recorded on a computer. The procedure may vary among health care providers and hospitals. What happens after the procedure?  You may return to your normal, everyday life, including diet, activities, and medicines, unless your health care provider tells you not to do that. Summary  An echocardiogram is a procedure that uses painless sound waves (ultrasound) to produce an image of the heart.  Images from an echocardiogram can provide important information about the size and shape of your heart, heart muscle function, heart valve function, and fluid buildup around your heart.  You do not need to do anything to prepare before this procedure. You may eat and drink normally.  After the echocardiogram is completed,  you may return to your normal, everyday life, unless your health care provider tells you not to do that. This information is not intended to replace advice given to you by your health care provider. Make sure you discuss any questions you have with your health care provider. Document Revised: 12/15/2018 Document Reviewed: 09/26/2016 Elsevier Patient Education  [place].

## 2020-06-30 ENCOUNTER — Other Ambulatory Visit: Payer: Self-pay | Admitting: Medical-Surgical

## 2020-06-30 ENCOUNTER — Other Ambulatory Visit: Payer: Self-pay | Admitting: Family Medicine

## 2020-06-30 MED ORDER — GLIPIZIDE ER 10 MG PO TB24: 10.0000 mg | ORAL_TABLET | Freq: Two times a day (BID) | ORAL | 1 refills | Status: DC

## 2020-06-30 MED ORDER — LOSARTAN POTASSIUM 25 MG PO TABS: 25.0000 mg | ORAL_TABLET | Freq: Every day | ORAL | 1 refills | Status: DC

## 2020-06-30 NOTE — Telephone Encounter (Signed)
Routing to provider- Patient Danny Morgan does not work with this practice.

## 2020-07-13 ENCOUNTER — Other Ambulatory Visit: Payer: Self-pay

## 2020-07-13 ENCOUNTER — Emergency Department (INDEPENDENT_AMBULATORY_CARE_PROVIDER_SITE_OTHER)
Admission: EM | Admit: 2020-07-13 | Discharge: 2020-07-13 | Disposition: A | Discharge status: Home / Self Care | Payer: BC Managed Care – PPO | Source: Home / Self Care | Attending: Family Medicine | Admitting: Family Medicine

## 2020-07-13 DIAGNOSIS — B349 Viral infection, unspecified: Secondary | ICD-10-CM

## 2020-07-13 NOTE — ED Triage Notes (Signed)
x1 day. Pt states that he has a headache, fatigue, fever. Pt states that he is vaccinated.

## 2020-07-13 NOTE — Discharge Instructions (Addendum)
If increasing cold-like symptoms develop, try the following: Take plain guaifenesin (1200mg  extended release tabs such as Mucinex) twice daily, with plenty of water, for cough and congestion.  May add Pseudoephedrine (30mg , one or two every 4 to 6 hours) for sinus congestion.  Get adequate rest.   May use Afrin nasal spray (or generic oxymetazoline) each morning for about 5 days and then discontinue.  Also recommend using saline nasal spray several times daily and saline nasal irrigation (AYR is a common brand).  Use Flonase nasal spray each morning after using Afrin nasal spray and saline nasal irrigation. Try warm salt water gargles for sore throat.  Stop all antihistamines for now, and other non-prescription cough/cold preparations. May take Delsym Cough Suppressant ("12 Hour Cough Relief") at bedtime for nighttime cough.  May take Tylenol as needed for fever, headache, etc.   Isolate yourself until COVID-19 test result is available.  If your COVID19 test is positive, then you are infected with the novel coronavirus and could give the virus to others.  Please continue isolation at home for at least 10 days since the start of your symptoms.  Once you complete your 10 day quarantine, you may return to normal activities as long as you've not had a fever for over 24 hours (without taking fever reducing medicine) and your symptoms are improving. Please continue good preventive care measures, including:  frequent hand-washing, avoid touching your face, cover coughs/sneezes, stay out of crowds and keep a 6 foot distance from others.  Go to the nearest hospital emergency room if fever/cough/breathlessness are severe or illness seems like a threat to life.

## 2020-07-13 NOTE — ED Provider Notes (Signed)
[name]    CSN: [id number] Arrival date & time: 07/13/20  1326      History   Chief Complaint Chief Complaint  Patient presents with  . Headache    HPI Danny Morgan. Danny Morgan. is a 64 y.o. male.   This morning patient developed fatigue, chills, nausea without vomiting, and headache.  He denies cough but has chronic nasal congestion. He had COVID infection in December, and vaccination June 2021.  The history is provided by the patient.    Past Medical History:  Diagnosis Date  . Allergy   . Anxiety   . Cataract    beginning stage in rt. eye  . Colon polyps   . Diabetes mellitus without complication ([place])   . GERD (gastroesophageal reflux disease)   . Hyperlipidemia     Patient Active Problem List   Diagnosis Date Noted  . DOE (dyspnea on exertion) 06/24/2020  . Chest discomfort 06/24/2020  . Morbid obesity ([place]) 06/24/2020  . Cardiac murmur 06/24/2020  . Allergy   . Anxiety   . Cataract   . Colon polyps   . Diabetes mellitus without complication ([place])   . GERD (gastroesophageal reflux disease)   . Hyperlipidemia     Past Surgical History:  Procedure Laterality Date  . COLONOSCOPY W/ BIOPSIES AND POLYPECTOMY    . RETINAL DETACHMENT SURGERY     age 45  . WISDOM TOOTH EXTRACTION         Home Medications    Prior to Admission medications   Medication Sig Start Date End Date Taking? Authorizing Provider  aspirin EC 81 MG tablet Take 1 tablet (81 mg total) by mouth daily. Swallow whole. 06/24/20  Yes Revankar, Reita Cliche, MD  atorvastatin (LIPITOR) 40 MG tablet Take 1 tablet by mouth daily. 10/22/17  Yes [provider]  Cholecalciferol (VITAMIN D PO) Take by mouth daily.   Yes [provider]  diphenhydrAMINE (BENADRYL) 25 MG tablet Take 25 mg by mouth daily.   Yes [provider]  glipiZIDE (GLUCOTROL) 5 MG tablet Take 1 tablet (5 mg total) by mouth daily with supper. Patient taking differently: Take 5 mg by  mouth 2 (two) times daily.  06/05/20  Yes Scot Jun, FNP  loratadine (CLARITIN) 10 MG tablet Take 1 tablet by mouth daily.    Yes [provider]  LORazepam (ATIVAN) 1 MG tablet Take 1 tablet (1 mg total) by mouth daily as needed for anxiety. 06/19/20  Yes Samuel Bouche, NP  losartan (COZAAR) 25 MG tablet Take 1 tablet (25 mg total) by mouth daily. 06/30/20  Yes Samuel Bouche, NP  omeprazole (PRILOSEC) 10 MG capsule Take 10 mg by mouth daily.    Yes [provider]  glipiZIDE (GLUCOTROL XL) 10 MG 24 hr tablet Take 1 tablet (10 mg total) by mouth 2 (two) times daily. 06/30/20   Samuel Bouche, NP    Family History Family History  Problem Relation Age of Onset  . Parkinson's disease Father     Social History Social History   Tobacco Use  . Smoking status: Former Smoker    Types: Cigarettes  . Smokeless tobacco: Never Used  . Tobacco comment: quit at age 15  Vaping Use  . Vaping Use: Never used  Substance Use Topics  . Alcohol use: Yes    Comment: Occasionally  . Drug use: No     Allergies   Penicillins   Review of Systems Review of Systems No sore throat No  cough No pleuritic pain No wheezing + nasal congestion + post-nasal drainage + sinus pain/pressure No itchy/red eyes No earache No hemoptysis No SOB No fever/+ chills + nausea No vomiting No abdominal pain No diarrhea No urinary symptoms No skin rash + fatigue No myalgias + headache   Physical Exam Triage Vital Signs ED Triage Vitals  Enc Vitals Group     BP 07/13/20 1503 125/74     Pulse Rate 07/13/20 1503 (!) 101     Resp --      Temp 07/13/20 1503 99.9 F (37.7 C)     Temp Source 07/13/20 1503 Oral     SpO2 07/13/20 1503 97 %     Weight 07/13/20 1501 281 lb (127.5 kg)     Height 07/13/20 1501 5' 10.5" (1.791 m)     Head Circumference --      Peak Flow --      Pain Score 07/13/20 1500 5     Pain Loc --      Pain Edu? --      Excl. in [place]? --    No data  found.  Updated Vital Signs BP 125/74 (BP Location: Right Arm)   Pulse (!) 101   Temp 99.9 F (37.7 C) (Oral)   Ht 5' 10.5" (1.791 m)   Wt 127.5 kg   SpO2 97%   BMI 39.75 kg/m   Visual Acuity Right Eye Distance:   Left Eye Distance:   Bilateral Distance:    Right Eye Near:   Left Eye Near:    Bilateral Near:     Physical Exam Nursing notes and Vital Signs reviewed. Appearance:  Patient appears stated age, and in no acute distress Eyes:  Pupils are equal, round, and reactive to light and accomodation.  Extraocular movement is intact.  Conjunctivae are not inflamed  Ears:  Canals normal.  Tympanic membranes normal.  Nose:  Mildly congested turbinates.  No sinus tenderness.  Pharynx:  Normal Neck:  Supple.  Mildly enlarged lateral nodes are present, tender to palpation on the left.   Lungs:  Clear to auscultation.  Breath sounds are equal.  Moving air well. Heart:  Regular rate and rhythm without murmurs, rubs, or gallops.  Abdomen:  Nontender without masses or hepatosplenomegaly.  Bowel sounds are present.  No CVA or flank tenderness.  Extremities:  No edema.  Skin:  No rash present.   UC Treatments / Results  Labs (all labs ordered are listed, but only abnormal results are displayed) Labs Reviewed  COVID-19, FLU A+B AND RSV    EKG   Radiology No results found.  Procedures Procedures (including critical care time)  Medications Ordered in UC Medications - No data to display  Initial Impression / Assessment and Plan / UC Course  I have reviewed the triage vital signs and the nursing notes.  Pertinent labs & imaging results that were available during my care of the patient were reviewed by me and considered in my medical decision making (see chart for details).    There is no evidence of bacterial infection today.  Treat symptomatically for now. COVID PCR, RSV, Flu A and B pending. Followup with Family Doctor if not improved in about 10 days.   Final  Clinical Impressions(s) / UC Diagnoses   Final diagnoses:  Acute viral syndrome     Discharge Instructions     If increasing cold-like symptoms develop, try the following: Take plain guaifenesin (1200mg  extended release tabs such as Mucinex) twice daily,  with plenty of water, for cough and congestion.  May add Pseudoephedrine (30mg , one or two every 4 to 6 hours) for sinus congestion.  Get adequate rest.   May use Afrin nasal spray (or generic oxymetazoline) each morning for about 5 days and then discontinue.  Also recommend using saline nasal spray several times daily and saline nasal irrigation (AYR is a common brand).  Use Flonase nasal spray each morning after using Afrin nasal spray and saline nasal irrigation. Try warm salt water gargles for sore throat.  Stop all antihistamines for now, and other non-prescription cough/cold preparations. May take Delsym Cough Suppressant ("12 Hour Cough Relief") at bedtime for nighttime cough.  May take Tylenol as needed for fever, headache, etc.   Isolate yourself until COVID-19 test result is available.  If your COVID19 test is positive, then you are infected with the novel coronavirus and could give the virus to others.  Please continue isolation at home for at least 10 days since the start of your symptoms.  Once you complete your 10 day quarantine, you may return to normal activities as long as you've not had a fever for over 24 hours (without taking fever reducing medicine) and your symptoms are improving. Please continue good preventive care measures, including:  frequent hand-washing, avoid touching your face, cover coughs/sneezes, stay out of crowds and keep a 6 foot distance from others.  Go to the nearest hospital emergency room if fever/cough/breathlessness are severe or illness seems like a threat to life.     ED Prescriptions    None        Kandra Nicolas, MD 07/16/20 [phone]

## 2020-07-17 ENCOUNTER — Telehealth (HOSPITAL_COMMUNITY): Payer: Self-pay | Admitting: *Deleted

## 2020-07-17 LAB — COVID-19, FLU A+B AND RSV
Influenza A, NAA: NOT DETECTED
Influenza B, NAA: NOT DETECTED
RSV, NAA: NOT DETECTED
SARS-CoV-2, NAA: NOT DETECTED

## 2020-07-17 NOTE — Telephone Encounter (Signed)
Left message on voicemail per DPR in reference to upcoming appointment scheduled on 07/22/20 with detailed instructions given per Myocardial Perfusion Study Information Sheet for the test. LM to arrive 15 minutes early, and that it is imperative to arrive on time for appointment to keep from having the test rescheduled. If you need to cancel or reschedule your appointment, please call the office within 24 hours of your appointment. Failure to do so may result in a cancellation of your appointment, and a $50 no show fee. Phone number given for call back for any questions. Kirstie Peri

## 2020-07-22 ENCOUNTER — Other Ambulatory Visit: Payer: Self-pay

## 2020-07-22 ENCOUNTER — Ambulatory Visit (HOSPITAL_BASED_OUTPATIENT_CLINIC_OR_DEPARTMENT_OTHER): Payer: BC Managed Care – PPO

## 2020-07-22 ENCOUNTER — Ambulatory Visit (HOSPITAL_COMMUNITY): Payer: BC Managed Care – PPO | Attending: Cardiology

## 2020-07-22 DIAGNOSIS — R011 Cardiac murmur, unspecified: Secondary | ICD-10-CM | POA: Diagnosis not present

## 2020-07-22 DIAGNOSIS — R0600 Dyspnea, unspecified: Secondary | ICD-10-CM | POA: Insufficient documentation

## 2020-07-22 DIAGNOSIS — R0609 Other forms of dyspnea: Secondary | ICD-10-CM

## 2020-07-22 DIAGNOSIS — R072 Precordial pain: Secondary | ICD-10-CM | POA: Insufficient documentation

## 2020-07-22 DIAGNOSIS — R0789 Other chest pain: Secondary | ICD-10-CM | POA: Insufficient documentation

## 2020-07-22 DIAGNOSIS — E119 Type 2 diabetes mellitus without complications: Secondary | ICD-10-CM

## 2020-07-22 LAB — ECHOCARDIOGRAM COMPLETE
Area-P 1/2: 3.19 cm2
Height: 71 in
S' Lateral: 3.5 cm
Weight: 4576 oz

## 2020-07-22 MED ORDER — TECHNETIUM TC 99M TETROFOSMIN IV KIT
31.6000 | PACK | Freq: Once | INTRAVENOUS | Status: AC | PRN
  Administered 2020-07-22: 31.6 via INTRAVENOUS
  Filled 2020-07-22: qty 32

## 2020-07-22 MED ORDER — REGADENOSON 0.4 MG/5ML IV SOLN
0.4000 mg | Freq: Once | INTRAVENOUS | Status: AC
  Administered 2020-07-22: 0.4 mg via INTRAVENOUS

## 2020-07-23 ENCOUNTER — Ambulatory Visit (HOSPITAL_COMMUNITY): Payer: BC Managed Care – PPO | Attending: Cardiology

## 2020-07-23 LAB — MYOCARDIAL PERFUSION IMAGING
LV dias vol: 86 mL (ref 62–150)
LV sys vol: 28 mL
Peak HR: 104 {beats}/min
Rest HR: 73 {beats}/min
SDS: 0
SRS: 0
SSS: 0
TID: 0.97

## 2020-07-23 MED ORDER — TECHNETIUM TC 99M TETROFOSMIN IV KIT
31.5000 | PACK | Freq: Once | INTRAVENOUS | Status: AC | PRN
  Administered 2020-07-23: 31.5 via INTRAVENOUS
  Filled 2020-07-23: qty 32

## 2020-07-27 ENCOUNTER — Other Ambulatory Visit: Payer: Self-pay | Admitting: Family Medicine

## 2020-07-29 ENCOUNTER — Telehealth: Payer: Self-pay

## 2020-07-29 NOTE — Telephone Encounter (Signed)
[place] (1st attempt)     Pt called and LVM stating that he has had blood in his stool for the last week. He states that he had to have a lot if cardiac testing done recently and that the blood was first noticed after those test. He is wondering if one of the tests could have put too much pressure on something.   Pt needs to be scheduled for further evaluation.

## 2020-07-30 ENCOUNTER — Encounter: Payer: Self-pay | Admitting: Medical-Surgical

## 2020-07-30 ENCOUNTER — Other Ambulatory Visit: Payer: Self-pay

## 2020-07-30 ENCOUNTER — Ambulatory Visit (INDEPENDENT_AMBULATORY_CARE_PROVIDER_SITE_OTHER): Payer: BC Managed Care – PPO | Admitting: Medical-Surgical

## 2020-07-30 VITALS — BP 164/74 | HR 86 | Temp 97.9°F | Ht 71.0 in | Wt 287.6 lb

## 2020-07-30 DIAGNOSIS — K921 Melena: Secondary | ICD-10-CM | POA: Diagnosis not present

## 2020-07-30 MED ORDER — HYDROCORTISONE ACETATE 25 MG RE SUPP: 25.0000 mg | Freq: Two times a day (BID) | RECTAL | 2 refills | Status: DC | PRN

## 2020-07-30 NOTE — Progress Notes (Signed)
Subjective:    CC: hematochezia  HPI: Danny Morgan 64 year old male presenting today with reports of blood in stools.  His symptoms started after he had his cardiac perfusion test on 11/15 and 11/16.  On the morning of 11/17, he awoke and had a bowel movement as usual.  When he looked in the toilet there was bright red blood in the water with brown stool.  The next morning, he again woke and had a bowel movement that was also brown with bright red noted in the toilet water.  On the third morning, his stool was unformed but not watery/diarrhea and was normal.  The fourth morning, there was again blood in the toilet water but the stool was brown.  This morning, he noted there was no blood visible in either the stool or the toilet water this morning.  He does have a history of internal hemorrhoids noted on his colonoscopy in 2019.  Has not been taking NSAIDs but does sporadically take a baby aspirin daily.  Endorses occasional nausea and abdominal cramping but nothing out of the ordinary since he noticed blood in his stools.  Denies fever, chills, significant shortness of breath or dizziness.  I reviewed the past medical history, family history, social history, surgical history, and allergies today and no changes were needed.  Please see the problem list section below in epic for further details.  Past Medical History: Past Medical History:  Diagnosis Date  . Allergy   . Anxiety   . Cataract    beginning stage in rt. eye  . Colon polyps   . Diabetes mellitus without complication ([place])   . GERD (gastroesophageal reflux disease)   . Hyperlipidemia    Past Surgical History: Past Surgical History:  Procedure Laterality Date  . COLONOSCOPY W/ BIOPSIES AND POLYPECTOMY    . RETINAL DETACHMENT SURGERY     age 15  . WISDOM TOOTH EXTRACTION     Social History: Social History   Socioeconomic History  . Marital status: Married    Spouse name: Not on file  . Number of children: Not on file  . Years  of education: Not on file  . Highest education level: Not on file  Occupational History  . Not on file  Tobacco Use  . Smoking status: Former Smoker    Types: Cigarettes  . Smokeless tobacco: Never Used  . Tobacco comment: quit at age 74  Vaping Use  . Vaping Use: Never used  Substance and Sexual Activity  . Alcohol use: Yes    Comment: Occasionally  . Drug use: No  . Sexual activity: Not Currently  Other Topics Concern  . Not on file  Social History Narrative  . Not on file   Social Determinants of Health   Financial Resource Strain:   . Difficulty of Paying Living Expenses: Not on file  Food Insecurity:   . Worried About Charity fundraiser in the Last Year: Not on file  . Ran Out of Food in the Last Year: Not on file  Transportation Needs:   . Lack of Transportation (Medical): Not on file  . Lack of Transportation (Non-Medical): Not on file  Physical Activity:   . Days of Exercise per Week: Not on file  . Minutes of Exercise per Session: Not on file  Stress:   . Feeling of Stress : Not on file  Social Connections:   . Frequency of Communication with Friends and Family: Not on file  . Frequency of Social Gatherings with  Friends and Family: Not on file  . Attends Religious Services: Not on file  . Active Member of Clubs or Organizations: Not on file  . Attends Archivist Meetings: Not on file  . Marital Status: Not on file   Family History: Family History  Problem Relation Age of Onset  . Parkinson's disease Father    Allergies: Allergies  Allergen Reactions  . Penicillins Shortness Of Breath and Swelling   Medications: See med rec.  Review of Systems: See HPI for pertinent positives and negatives.   Objective:    General: Well Developed, well nourished, and in no acute distress.  Neuro: Alert and oriented x3.  HEENT: Normocephalic, atraumatic.  Skin: Warm and dry. Cardiac: Regular rate and rhythm, no murmurs rubs or gallops, no lower  extremity edema.  Respiratory: Clear to auscultation bilaterally. Not using accessory muscles, speaking in full sentences. Abdomen: Soft, nontender, nondistended. Bowel sounds + x 4 quadrants. No HSM appreciated.  Soft, reducible umbilical hernia.  Impression and Recommendations:    1. Hematochezia Checking CBC today.  Strongly suspect this is a bleeding internal hemorrhoid with the noted pattern of bleeding.  Brown stools are very reassuring.  Sending in Anusol suppositories twice daily as needed.  Recommend contacting his GI provider for further recommendations and evaluations as they may need to do a repeat scope for possible lower GI bleed should his symptoms continue. - CBC  Return if symptoms worsen or fail to improve. ___________________________________________ Clearnce Sorrel, DNP, APRN, FNP-BC [facility] [place]

## 2020-07-30 NOTE — Telephone Encounter (Signed)
Spoke with pt who states that after all of the cardiac testing he had recently he has noticed blood in his stool. Looking in his chart it looks like he had a echocardiogram and a lexiscan stress test. He states that when the testing was going on he told the tech that it was too tight around him. I told him that it would be best to bring him in for evaluation. Pt agreeable to appt so he has been scheduled to see Joy this afternoon at 1600

## 2020-07-31 ENCOUNTER — Telehealth: Payer: Self-pay | Admitting: Gastroenterology

## 2020-07-31 LAB — CBC
HCT: 46.8 % (ref 38.5–50.0)
Hemoglobin: 15.6 g/dL (ref 13.2–17.1)
MCH: 27.1 pg (ref 27.0–33.0)
MCHC: 33.3 g/dL (ref 32.0–36.0)
MCV: 81.4 fL (ref 80.0–100.0)
MPV: 11.6 fL (ref 7.5–12.5)
Platelets: 277 10*3/uL (ref 140–400)
RBC: 5.75 10*6/uL (ref 4.20–5.80)
RDW: 12.8 % (ref 11.0–15.0)
WBC: 10.5 10*3/uL (ref 3.8–10.8)

## 2020-07-31 NOTE — Telephone Encounter (Signed)
Pt is requesting a call back from a nurse to discuss the blood in his stools he started noticing on 11/17.

## 2020-07-31 NOTE — Telephone Encounter (Signed)
Spoke with patient he states that he has been experiencing rectal bleeding since he had cardiac perfusion test 07/22/20 and 07/23/20, pt states that something heavy was placed on him for this test, pt states that he is experiencing urgency as well, noticed a lot of red blood in the commode, pt states that he has noticed BRB in his stool as well, pt states that he saw his PCP yesterday she recommended a follow up with GI. Advised patient that he can try OTC suppositories, looks like PCP sent in a prescription for Anusol suppositories. Pt did have internal hemorrhoids noted on last colonoscopy, advised that if hemorrhoids are inflamed or irritated it can cause rectal bleeding, advised that if his stools are hard/firm he can introduce Miralax as needed. Advised patient that if he notices an increase in blood, feeling lightheaded or dizziness he needs to go to the ED. Patient has been scheduled for a follow up with Danny Morgan on 07/2920 at 3 PM. Patient verbalized understanding of all information and had no other concerns at the end of the call.

## 2020-08-05 ENCOUNTER — Ambulatory Visit (INDEPENDENT_AMBULATORY_CARE_PROVIDER_SITE_OTHER): Payer: BC Managed Care – PPO | Admitting: Physician Assistant

## 2020-08-05 ENCOUNTER — Encounter: Payer: Self-pay | Admitting: Physician Assistant

## 2020-08-05 VITALS — BP 132/84 | HR 66 | Ht 71.0 in | Wt 287.0 lb

## 2020-08-05 DIAGNOSIS — K648 Other hemorrhoids: Secondary | ICD-10-CM | POA: Diagnosis not present

## 2020-08-05 DIAGNOSIS — Z86010 Personal history of colonic polyps: Secondary | ICD-10-CM | POA: Diagnosis not present

## 2020-08-05 DIAGNOSIS — K625 Hemorrhage of anus and rectum: Secondary | ICD-10-CM

## 2020-08-05 DIAGNOSIS — R194 Change in bowel habit: Secondary | ICD-10-CM

## 2020-08-05 NOTE — Progress Notes (Signed)
Subjective:    Patient ID: Danny Morgan. Danny Morgan., male    DOB: Nov 18, 1955, 64 y.o.   MRN: [id number]  HPI Danny Morgan is a pleasant 64 year old white male, established with Dr. Havery Moros, who was last seen in 2019.  He comes in today after a recent episode of rectal bleeding and has also noted any change in bowel habits. Patient has history of GERD, adult onset diabetes mellitus, morbid obesity, and history of adenomatous colon polyps.  He had COVID-19 in December 2020 and has still been experiencing some shortness of breath. Last colonoscopy March 2019 with 7 polyps removed, the largest 7 mm, there were a few sigmoid diverticuli and moderate internal hemorrhoids.  Path on the polyps all consistent with tubular adenomas and he is indicated for 3-year interval follow-up. Patient relates that he underwent cardiac work-up on 07/22/2020 with echo and Lexiscan.  He says he had discomfort from the pressure of the machine with the Lexiscan and was quite uncomfortable after that.  The following day he noted bright red blood with a bowel movement.  This occurred over the next couple of days with each bowel movement.  He feels that the blood was separate from the bowel movement and he did not have any melena.  No associated abdominal pain or rectal pain. He has noticed over the past few months that his stool has had a more foul odor and has been mushier  in general.  The rectal bleeding has resolved at this point and he has not had any further blood noted with the bowel movement over the past week.. On review he did start Ozempic very recently and had been on Glucophage but dose was doubled to 850  twice daily within the past couple of months.  He has also been more gassy in general.  Appetite has been fine, weight has been stable. He did have a prescription for hydrocortisone suppository called in by his PCP but had not used those as yet. Patient was aware that he had internal hemorrhoids but has never had any  bleeding in the past.  Review of Systems Pertinent positive and negative review of systems were noted in the above HPI section.  All other review of systems was otherwise negative.  Outpatient Encounter Medications as of 08/05/2020  Medication Sig   aspirin EC 81 MG tablet Take 1 tablet (81 mg total) by mouth daily. Swallow whole.   atorvastatin (LIPITOR) 40 MG tablet Take 1 tablet by mouth daily.   cetirizine (ZYRTEC) 10 MG tablet Take 10 mg by mouth daily.   Cholecalciferol (VITAMIN D PO) Take by mouth daily.   glipiZIDE (GLUCOTROL) 5 MG tablet Take 1 tablet (5 mg total) by mouth daily with supper. (Patient taking differently: Take 5 mg by mouth 2 (two) times daily. )   hydrocortisone (ANUSOL-HC) 25 MG suppository Place 1 suppository (25 mg total) rectally 2 (two) times daily as needed for hemorrhoids.   loratadine (CLARITIN) 10 MG tablet Take 1 tablet by mouth daily.    LORazepam (ATIVAN) 1 MG tablet Take 1 tablet (1 mg total) by mouth daily as needed for anxiety.   metFORMIN (GLUCOPHAGE) 850 MG tablet Take 850 mg by mouth 2 (two) times daily with a meal.   omeprazole (PRILOSEC) 10 MG capsule Take 10 mg by mouth daily.    OZEMPIC, 0.25 OR 0.5 MG/DOSE, 2 MG/1.5ML SOPN Inject into the skin.   [DISCONTINUED] diphenhydrAMINE (BENADRYL) 25 MG tablet Take 25 mg by mouth daily.   [DISCONTINUED] glipiZIDE (  GLUCOTROL XL) 10 MG 24 hr tablet Take 1 tablet (10 mg total) by mouth 2 (two) times daily.   [DISCONTINUED] losartan (COZAAR) 25 MG tablet Take 1 tablet (25 mg total) by mouth daily.   [DISCONTINUED] 0.9 %  sodium chloride infusion    No facility-administered encounter medications on file as of 08/05/2020.   Allergies  Allergen Reactions   Penicillins Shortness Of Breath and Swelling   Patient Active Problem List   Diagnosis Date Noted   DOE (dyspnea on exertion) 06/24/2020   Chest discomfort 06/24/2020   Morbid obesity ([place]) 06/24/2020   Cardiac murmur 06/24/2020     Allergy    Anxiety    Cataract    Colon polyps    Diabetes mellitus without complication ([facility])    GERD (gastroesophageal reflux disease)    Hyperlipidemia    Social History   Socioeconomic History   Marital status: Married    Spouse name: Not on file   Number of children: Not on file   Years of education: Not on file   Highest education level: Not on file  Occupational History   Not on file  Tobacco Use   Smoking status: Former Smoker    Types: Cigarettes   Smokeless tobacco: Never Used   Tobacco comment: quit at age 25  Vaping Use   Vaping Use: Never used  Substance and Sexual Activity   Alcohol use: Yes    Comment: Occasionally   Drug use: No   Sexual activity: Not Currently  Other Topics Concern   Not on file  Social History Narrative   Not on file   Social Determinants of Health   Financial Resource Strain:    Difficulty of Paying Living Expenses: Not on file  Food Insecurity:    Worried About Charity fundraiser in the Last Year: Not on file   [employer] of Food in the Last Year: Not on file  Transportation Needs:    Lack of Transportation (Medical): Not on file   Lack of Transportation (Non-Medical): Not on file  Physical Activity:    Days of Exercise per Week: Not on file   Minutes of Exercise per Session: Not on file  Stress:    Feeling of Stress : Not on file  Social Connections:    Frequency of Communication with Friends and Family: Not on file   Frequency of Social Gatherings with Friends and Family: Not on file   Attends Religious Services: Not on file   Active Member of Clubs or Organizations: Not on file   Attends Archivist Meetings: Not on file   Marital Status: Not on file  Intimate Partner Violence:    Fear of Current or Ex-Partner: Not on file   Emotionally Abused: Not on file   Physically Abused: Not on file   Sexually Abused: Not on file    Danny Morgan family history includes  Parkinson's disease in his father.      Objective:    Vitals:   08/05/20 1457  BP: 132/84  Pulse: 66    Physical Exam Well-developed well-nourished   older WM in no acute distress.  Height, Weight,287  BMI 40.0  HEENT; nontraumatic normocephalic, EOMI, PE R LA, sclera anicteric. Oropharynx;not examined Neck; supple, no JVD Cardiovascular; regular rate and rhythm with S1-S2, no murmur rub or gallop Pulmonary; Clear bilaterally Abdomen; soft,obese, nontender, nondistended, no palpable mass or hepatosplenomegaly, bowel sounds are active Rectal; not done today Skin; benign exam, no jaundice rash or  appreciable lesions Extremities; no clubbing cyanosis or edema skin warm and dry Neuro/Psych; alert and oriented x4, grossly nonfocal mood and affect appropriate       Assessment & Plan:   #37 64 year old white male with recent hematochezia, noting bright red blood with bowel movements over several days which has since resolved. No associated abdominal or rectal pain. Suspect the bleeding is secondary to previously documented internal hemorrhoids, however cannot rule out occult neoplasm, or inflammatory process i.e. proctitis.  #2 recent change in bowel habits with softer mushy stool, malodorous and with increase in gas. Patient has recently started Ozempic and also double dose of Glucophage and suspect these  meds may be the etiology of alteration in bowel habits.  #3 history of multiple adenomatous colon polyps-last colonoscopy March 2019 due for follow-up early 2022 4.  Diverticulosis 5.  Obesity 6.  Adult onset diabetes mellitus 7.  GERD 8.  Recent negative echo and Lexiscan stress test-this is in the setting of COVID-26 August 2019 and persistent shortness of breath  Plan;Discussed use of Anusol HC suppositories nightly x7 days for any recurrence of bleeding and repeat as needed. Patient will be scheduled for colonoscopy with Dr. Havery Moros.  Procedure was discussed in  detail with the patient including indications risk and benefits and he is agreeable to proceed. Patient has completed COVID-19 vaccination. Further recommendations pending findings at colonoscopy.  We briefly discussed in office hemorrhoidal banding should his bleeding be proven secondary to internal hemorrhoids.  As this was his first episode of such bleeding he may not require banding as yet, but can be offered if has recurrent/persistent symptoms.  Philamena Kramar Genia Harold PA-C 08/05/2020   Cc: Samuel Bouche, NP

## 2020-08-05 NOTE — Progress Notes (Signed)
Agree with assessment and plan as outlined.  

## 2020-08-05 NOTE — Patient Instructions (Signed)
If you are age 65 or older, your body mass index should be between 23-30. Your Body mass index is 40.03 kg/m. If this is out of the aforementioned range listed, please consider follow up with your Primary Care Provider.  If you are age 74 or younger, your body mass index should be between 19-25. Your Body mass index is 40.03 kg/m. If this is out of the aformentioned range listed, please consider follow up with your Primary Care Provider.   You have been scheduled for a colonoscopy. Please follow written instructions given to you at your visit today.  Please pick up your prep supplies at the pharmacy within the next 1-3 days. If you use inhalers (even only as needed), please bring them with you on the day of your procedure.  Use Anusol HC Suppositories at bedtime for 7 days as needed for recurrent bleeding. * Call the office if you need any refills  Follow up as needed.

## 2020-08-09 ENCOUNTER — Other Ambulatory Visit: Payer: Self-pay | Admitting: Family Medicine

## 2020-08-12 ENCOUNTER — Encounter: Payer: Self-pay | Admitting: Gastroenterology

## 2020-08-12 ENCOUNTER — Other Ambulatory Visit: Payer: Self-pay

## 2020-08-12 ENCOUNTER — Ambulatory Visit (AMBULATORY_SURGERY_CENTER): Payer: BC Managed Care – PPO | Admitting: Gastroenterology

## 2020-08-12 VITALS — BP 120/81 | HR 80 | Temp 95.6°F | Resp 15 | Ht 71.0 in | Wt 287.0 lb

## 2020-08-12 DIAGNOSIS — K648 Other hemorrhoids: Secondary | ICD-10-CM

## 2020-08-12 DIAGNOSIS — K5730 Diverticulosis of large intestine without perforation or abscess without bleeding: Secondary | ICD-10-CM

## 2020-08-12 DIAGNOSIS — Z86010 Personal history of colonic polyps: Secondary | ICD-10-CM

## 2020-08-12 DIAGNOSIS — K625 Hemorrhage of anus and rectum: Secondary | ICD-10-CM

## 2020-08-12 MED ORDER — SODIUM CHLORIDE 0.9 % IV SOLN: 500.0000 mL | INTRAVENOUS | Status: DC

## 2020-08-12 NOTE — Progress Notes (Signed)
Reviewed medical/surgical, noted  changes since office visit per patient.

## 2020-08-12 NOTE — Op Note (Signed)
[facility] Patient Name: Danny Morgan Procedure Date: 08/12/2020 9:55 AM MRN: [id number] Endoscopist: Remo Lipps P. Havery Moros , MD Age: 64 Referring MD:  Date of Birth: 1956-03-20 Gender: Male Account #: [id number] Procedure:                Colonoscopy Indications:              Rectal bleeding, history of multiple adenomas                            removed in 11/2017 Medicines:                Monitored Anesthesia Care Procedure:                Pre-Anesthesia Assessment:                           - Prior to the procedure, a History and Physical                            was performed, and patient medications and                            allergies were reviewed. The patient's tolerance of                            previous anesthesia was also reviewed. The risks                            and benefits of the procedure and the sedation                            options and risks were discussed with the patient.                            All questions were answered, and informed consent                            was obtained. Prior Anticoagulants: The patient has                            taken no previous anticoagulant or antiplatelet                            agents. ASA Grade Assessment: III - A patient with                            severe systemic disease. After reviewing the risks                            and benefits, the patient was deemed in                            satisfactory condition to undergo the procedure.  After obtaining informed consent, the colonoscope                            was passed under direct vision. Throughout the                            procedure, the patient's blood pressure, pulse, and                            oxygen saturations were monitored continuously. The                            Colonoscope was introduced through the anus and                            advanced to the the cecum, identified  by                            appendiceal orifice and ileocecal valve. The                            colonoscopy was performed without difficulty. The                            patient tolerated the procedure well. The quality                            of the bowel preparation was good. The ileocecal                            valve, appendiceal orifice, and rectum were                            photographed. Scope In: 10:00:04 AM Scope Out: 10:16:40 AM Scope Withdrawal Time: 0 hours 11 minutes 47 seconds  Total Procedure Duration: 0 hours 16 minutes 36 seconds  Findings:                 The perianal and digital rectal examinations were                            normal.                           Multiple small-mouthed diverticula were found in                            the sigmoid colon.                           Internal hemorrhoids were found during retroflexion.                           The exam was otherwise without abnormality. No  polyps. Complications:            No immediate complications. Estimated blood loss:                            None. Estimated Blood Loss:     Estimated blood loss: none. Impression:               - Diverticulosis in the sigmoid colon.                           - Internal hemorrhoids.                           - The examination was otherwise normal.                           - No polyps noted.                           Bleeding symptoms likely due to hemorrhoids noted                            on this exam. Symptoms have since stopped, follow                            up as needed if symptoms recur. Recommendation:           - Patient has a contact number available for                            emergencies. The signs and symptoms of potential                            delayed complications were discussed with the                            patient. Return to normal activities tomorrow.                             Written discharge instructions were provided to the                            patient.                           - Resume previous diet.                           - Continue present medications.                           - Repeat colonoscopy in 5 years for surveillance                            given multiple adenomas removed on last exam in  2019Remo Lipps P. Havery Moros, MD 08/12/2020 10:23:57 AM This report has been signed electronically.

## 2020-08-12 NOTE — Progress Notes (Signed)
Report given to PACU, vss 

## 2020-08-12 NOTE — Patient Instructions (Signed)
Information on hemorrhoids and diverticulosis given to you today.  Resume previous diet and medications.  Repeat colonoscopy in 5 years.  YOU HAD AN ENDOSCOPIC PROCEDURE TODAY AT [facility]:   Refer to the procedure report that was given to you for any specific questions about what was found during the examination.  If the procedure report does not answer your questions, please call your gastroenterologist to clarify.  If you requested that your care partner not be given the details of your procedure findings, then the procedure report has been included in a sealed envelope for you to review at your convenience later.  YOU SHOULD EXPECT: Some feelings of bloating in the abdomen. Passage of more gas than usual.  Walking can help get rid of the air that was put into your GI tract during the procedure and reduce the bloating. If you had a lower endoscopy (such as a colonoscopy or flexible sigmoidoscopy) you may notice spotting of blood in your stool or on the toilet paper. If you underwent a bowel prep for your procedure, you may not have a normal bowel movement for a few days.  Please Note:  You might notice some irritation and congestion in your nose or some drainage.  This is from the oxygen used during your procedure.  There is no need for concern and it should clear up in a day or so.  SYMPTOMS TO REPORT IMMEDIATELY:   Following lower endoscopy (colonoscopy or flexible sigmoidoscopy):  Excessive amounts of blood in the stool  Significant tenderness or worsening of abdominal pains  Swelling of the abdomen that is new, acute  Fever of 100F or higher   For urgent or emergent issues, a gastroenterologist can be reached at any hour by calling [phone]. Do not use MyChart messaging for urgent concerns.    DIET:  We do recommend a small meal at first, but then you may proceed to your regular diet.  Drink plenty of fluids but you should avoid alcoholic beverages for 24  hours.  ACTIVITY:  You should plan to take it easy for the rest of today and you should NOT DRIVE or use heavy machinery until tomorrow (because of the sedation medicines used during the test).    FOLLOW UP: Our staff will call the number listed on your records 48-72 hours following your procedure to check on you and address any questions or concerns that you may have regarding the information given to you following your procedure. If we do not reach you, we will leave a message.  We will attempt to reach you two times.  During this call, we will ask if you have developed any symptoms of COVID 19. If you develop any symptoms (ie: fever, flu-like symptoms, shortness of breath, cough etc.) before then, please call [phone].  If you test positive for Covid 19 in the 2 weeks post procedure, please call and report this information to [place].    If any biopsies were taken you will be contacted by phone or by letter within the next 1-3 weeks.  Please call us at [phone] if you have not heard about the biopsies in 3 weeks.    SIGNATURES/CONFIDENTIALITY: You and/or your care partner have signed paperwork which will be entered into your electronic medical record.  These signatures attest to the fact that that the information above on your After Visit Summary has been reviewed and is understood.  Full responsibility of the confidentiality of this discharge information lies with  you and/or your care-partner.

## 2020-08-12 NOTE — Telephone Encounter (Signed)
Please verify what he is taking. Our prior discussion, he was taking Glucotrol XL 10mg  BID. He was prescribed an additional 5mg  in the evening but had not started that. We decided to not start that extra dose and do Ozempic instead. Reconciliation showing differences in Glucotrol and Metformin.

## 2020-08-12 NOTE — Progress Notes (Signed)
HR > 100 with esmolol 25 mg given IV, MD updated, vss 

## 2020-08-12 NOTE — Telephone Encounter (Signed)
Spoke with pt who states that he is taking Glucotrol XL 10 mg twice a day as well as the Ozempic and he did not ever start the 5 mg dose. He said that his med list was recently reconciled when he had his colonoscopy and that he was not allowed to take his phone back with him, which is where he keeps his complete up-to-date med list. He said he just kind of went along with what they were saying since he did not have his phone in front of him.

## 2020-08-13 ENCOUNTER — Encounter: Payer: Self-pay | Admitting: Cardiology

## 2020-08-13 ENCOUNTER — Ambulatory Visit (INDEPENDENT_AMBULATORY_CARE_PROVIDER_SITE_OTHER): Payer: BC Managed Care – PPO | Admitting: Cardiology

## 2020-08-13 VITALS — BP 132/82 | HR 72 | Ht 71.0 in | Wt 284.1 lb

## 2020-08-13 DIAGNOSIS — E782 Mixed hyperlipidemia: Secondary | ICD-10-CM

## 2020-08-13 DIAGNOSIS — R0600 Dyspnea, unspecified: Secondary | ICD-10-CM | POA: Diagnosis not present

## 2020-08-13 DIAGNOSIS — E119 Type 2 diabetes mellitus without complications: Secondary | ICD-10-CM | POA: Diagnosis not present

## 2020-08-13 DIAGNOSIS — R0609 Other forms of dyspnea: Secondary | ICD-10-CM

## 2020-08-13 NOTE — Patient Instructions (Signed)

## 2020-08-13 NOTE — Progress Notes (Signed)
Cardiology Office Note:    Date:  08/13/2020   ID:  [name]. Danny Morgan., DOB 1956/04/25, MRN [id number]  PCP:  Samuel Bouche, NP  Cardiologist:  Jenean Lindau, MD   Referring MD: Samuel Bouche, NP    ASSESSMENT:    1. DOE (dyspnea on exertion)   2. Morbid obesity ([place])   3. Mixed hyperlipidemia   4. Diabetes mellitus without complication ([place])    PLAN:    In order of problems listed above:  1. Primary prevention stressed with the patient.  Importance of compliance to medication stressed any vocalized understanding. 2. Mixed dyslipidemia: Diet was emphasized.  Lipids are fine I reviewed them from the [facility] sheet. 3. Diabetes mellitus: Hemoglobin A1c is markedly elevated and I cautioned him about this.  He promises to do better. 4. Results of stress testing revealed to the patient and questions were answered to satisfaction 5. Morbid obesity: Risks explained to the patient and he promises to do better.  Importance of regular walking and exercise and diet stressed.Patient will be seen in follow-up appointment in 6 months or earlier if the patient has any concerns    Medication Adjustments/Labs and Tests Ordered: Current medicines are reviewed at length with the patient today.  Concerns regarding medicines are outlined above.  No orders of the defined types were placed in this encounter.  No orders of the defined types were placed in this encounter.    No chief complaint on file.    History of Present Illness:    Danny Morgan. Danny Morgan. is a 64 y.o. male.  Patient has past medical history of mixed dyslipidemia diabetes mellitus and morbid obesity.  He denies any problems at this time and takes care of activities of daily living.  No chest pain orthopnea or PND.  At the time of my evaluation, the patient is alert awake oriented and in no distress.  Past Medical History:  Diagnosis Date  . Allergy   . Anxiety   . Cardiac murmur 06/24/2020  . Cataract    beginning stage in  rt. eye  . Chest discomfort 06/24/2020  . Colon polyps   . Diabetes mellitus without complication ([place])   . DOE (dyspnea on exertion) 06/24/2020  . GERD (gastroesophageal reflux disease)   . Hyperlipidemia    states cholesterol normal states being given to protect kidneys  . Morbid obesity ([place]) 06/24/2020    Past Surgical History:  Procedure Laterality Date  . COLONOSCOPY W/ BIOPSIES AND POLYPECTOMY    . RETINAL DETACHMENT SURGERY     age 64  . WISDOM TOOTH EXTRACTION      Current Medications: Current Meds  Medication Sig  . atorvastatin (LIPITOR) 40 MG tablet Take 1 tablet by mouth daily.  . cetirizine (ZYRTEC) 10 MG tablet Take 10 mg by mouth daily.  . Cholecalciferol (VITAMIN D PO) Take by mouth daily.  Marland Kitchen glipiZIDE (GLUCOTROL XL) 10 MG 24 hr tablet Take 10 mg by mouth 2 (two) times daily.  . hydrocortisone (ANUSOL-HC) 25 MG suppository Place 1 suppository (25 mg total) rectally 2 (two) times daily as needed for hemorrhoids.  Marland Kitchen loratadine (CLARITIN) 10 MG tablet Take 1 tablet by mouth daily.   Marland Kitchen LORazepam (ATIVAN) 1 MG tablet Take 1 tablet (1 mg total) by mouth daily as needed for anxiety.  . metFORMIN (GLUCOPHAGE) 1000 MG tablet Take 1,000 mg by mouth 2 (two) times daily.  Marland Kitchen omeprazole (PRILOSEC) 10 MG capsule Take 10 mg by mouth daily.   Marland Kitchen  OZEMPIC, 0.25 OR 0.5 MG/DOSE, 2 MG/1.5ML SOPN Inject 0.25 mg into the skin once a week.    Current Facility-Administered Medications for the 08/13/20 encounter (Office Visit) with Anaira Seay, Reita Cliche, MD  Medication  . 0.9 %  sodium chloride infusion     Allergies:   Penicillins   Social History   Socioeconomic History  . Marital status: Married    Spouse name: Not on file  . Number of children: Not on file  . Years of education: Not on file  . Highest education level: Not on file  Occupational History  . Not on file  Tobacco Use  . Smoking status: Former Smoker    Types: Cigarettes  . Smokeless tobacco: Never Used  . Tobacco  comment: quit at age 82  Vaping Use  . Vaping Use: Never used  Substance and Sexual Activity  . Alcohol use: Yes    Comment: Occasionally  . Drug use: No  . Sexual activity: Not Currently  Other Topics Concern  . Not on file  Social History Narrative  . Not on file   Social Determinants of Health   Financial Resource Strain:   . Difficulty of Paying Living Expenses: Not on file  Food Insecurity:   . Worried About Charity fundraiser in the Last Year: Not on file  . Ran Out of Food in the Last Year: Not on file  Transportation Needs:   . Lack of Transportation (Medical): Not on file  . Lack of Transportation (Non-Medical): Not on file  Physical Activity:   . Days of Exercise per Week: Not on file  . Minutes of Exercise per Session: Not on file  Stress:   . Feeling of Stress : Not on file  Social Connections:   . Frequency of Communication with Friends and Family: Not on file  . Frequency of Social Gatherings with Friends and Family: Not on file  . Attends Religious Services: Not on file  . Active Member of Clubs or Organizations: Not on file  . Attends Archivist Meetings: Not on file  . Marital Status: Not on file     Family History: The patient's family history includes Parkinson's disease in his father. There is no history of Colon cancer, Stomach cancer, Pancreatic cancer, Esophageal cancer, or Rectal cancer.  ROS:   Please see the history of present illness.    All other systems reviewed and are negative.  EKGs/Labs/Other Studies Reviewed:    The following studies were reviewed today: Study Highlights    The left ventricular ejection fraction is hyperdynamic (>65%).  Nuclear stress EF: 67%.  There was no ST segment deviation noted during stress.  The study is normal.  This is a low risk study.   Normal stress nuclear study with no ischemia or infarction.  Gated ejection fraction 67% with normal wall motion.   IMPRESSIONS    1. Left  ventricular ejection fraction, by estimation, is 60 to 65%. The  left ventricle has normal function. The left ventricle has no regional  wall motion abnormalities. Left ventricular diastolic parameters are  consistent with Grade I diastolic  dysfunction (impaired relaxation).  2. Right ventricular systolic function is normal. The right ventricular  size is normal. Tricuspid regurgitation signal is inadequate for assessing  PA pressure.  3. The mitral valve is normal in structure. No evidence of mitral valve  regurgitation. No evidence of mitral stenosis.  4. The aortic valve is tricuspid. Aortic valve regurgitation is not  visualized.  Mild aortic valve sclerosis is present, with no evidence of  aortic valve stenosis.  5. Aortic dilatation noted. There is borderline dilatation of the aortic  root, measuring 37 mm.  6. The inferior vena cava is normal in size with greater than 50%  respiratory variability, suggesting right atrial pressure of 3 mmHg.     Recent Labs: 06/17/2020: ALT 25; BUN 20; Creat 1.02; Potassium 4.2; Sodium 137 07/30/2020: Hemoglobin 15.6; Platelets 277  Recent Lipid Panel    Component Value Date/Time   CHOL 103 06/17/2020 1108   TRIG 90 06/17/2020 1108   HDL 34 (L) 06/17/2020 1108   CHOLHDL 3.0 06/17/2020 1108   LDLCALC 52 06/17/2020 1108    Physical Exam:    VS:  BP 132/82   Pulse 72   Ht 5\' 11"  (1.803 m)   Wt 284 lb 1.3 oz (128.9 kg)   SpO2 98%   BMI 39.62 kg/m     Wt Readings from Last 3 Encounters:  08/13/20 284 lb 1.3 oz (128.9 kg)  08/12/20 287 lb (130.2 kg)  08/05/20 287 lb (130.2 kg)     GEN: Patient is in no acute distress HEENT: Normal NECK: No JVD; No carotid bruits LYMPHATICS: No lymphadenopathy CARDIAC: Hear sounds regular, 2/6 systolic murmur at the apex. RESPIRATORY:  Clear to auscultation without rales, wheezing or rhonchi  ABDOMEN: Soft, non-tender, non-distended MUSCULOSKELETAL:  No edema; No deformity  SKIN: Warm and  dry NEUROLOGIC:  Alert and oriented x 3 PSYCHIATRIC:  Normal affect   Signed, Jenean Lindau, MD  08/13/2020 4:29 PM    [place]

## 2020-08-14 ENCOUNTER — Telehealth: Payer: Self-pay | Admitting: *Deleted

## 2020-08-14 ENCOUNTER — Telehealth: Payer: Self-pay

## 2020-08-14 NOTE — Telephone Encounter (Signed)
No answer, left message to call if having any issues or concerns, B.Jelissa Espiritu RN 

## 2020-08-14 NOTE — Telephone Encounter (Signed)
Follow up call made. 

## 2020-09-17 ENCOUNTER — Other Ambulatory Visit: Payer: Self-pay

## 2020-09-17 ENCOUNTER — Ambulatory Visit (INDEPENDENT_AMBULATORY_CARE_PROVIDER_SITE_OTHER): Payer: Medicare HMO | Admitting: Medical-Surgical

## 2020-09-17 ENCOUNTER — Encounter: Payer: Self-pay | Admitting: Medical-Surgical

## 2020-09-17 VITALS — BP 119/76 | HR 73 | Temp 98.6°F | Ht 71.0 in | Wt 279.9 lb

## 2020-09-17 DIAGNOSIS — L729 Follicular cyst of the skin and subcutaneous tissue, unspecified: Secondary | ICD-10-CM

## 2020-09-17 DIAGNOSIS — L7211 Pilar cyst: Secondary | ICD-10-CM

## 2020-09-17 DIAGNOSIS — G4700 Insomnia, unspecified: Secondary | ICD-10-CM | POA: Insufficient documentation

## 2020-09-17 DIAGNOSIS — T7840XD Allergy, unspecified, subsequent encounter: Secondary | ICD-10-CM

## 2020-09-17 DIAGNOSIS — R69 Illness, unspecified: Secondary | ICD-10-CM | POA: Diagnosis not present

## 2020-09-17 DIAGNOSIS — E1169 Type 2 diabetes mellitus with other specified complication: Secondary | ICD-10-CM | POA: Diagnosis not present

## 2020-09-17 DIAGNOSIS — G5793 Unspecified mononeuropathy of bilateral lower limbs: Secondary | ICD-10-CM

## 2020-09-17 DIAGNOSIS — F419 Anxiety disorder, unspecified: Secondary | ICD-10-CM

## 2020-09-17 DIAGNOSIS — F4323 Adjustment disorder with mixed anxiety and depressed mood: Secondary | ICD-10-CM

## 2020-09-17 DIAGNOSIS — E119 Type 2 diabetes mellitus without complications: Secondary | ICD-10-CM

## 2020-09-17 DIAGNOSIS — R040 Epistaxis: Secondary | ICD-10-CM | POA: Diagnosis not present

## 2020-09-17 DIAGNOSIS — M79672 Pain in left foot: Secondary | ICD-10-CM | POA: Diagnosis not present

## 2020-09-17 DIAGNOSIS — M79671 Pain in right foot: Secondary | ICD-10-CM

## 2020-09-17 DIAGNOSIS — D126 Benign neoplasm of colon, unspecified: Secondary | ICD-10-CM

## 2020-09-17 DIAGNOSIS — Z638 Other specified problems related to primary support group: Secondary | ICD-10-CM | POA: Insufficient documentation

## 2020-09-17 DIAGNOSIS — R002 Palpitations: Secondary | ICD-10-CM | POA: Insufficient documentation

## 2020-09-17 DIAGNOSIS — N521 Erectile dysfunction due to diseases classified elsewhere: Secondary | ICD-10-CM | POA: Diagnosis not present

## 2020-09-17 DIAGNOSIS — Z6839 Body mass index (BMI) 39.0-39.9, adult: Secondary | ICD-10-CM | POA: Insufficient documentation

## 2020-09-17 HISTORY — DX: Benign neoplasm of colon, unspecified: D12.6

## 2020-09-17 HISTORY — DX: Unspecified mononeuropathy of bilateral lower limbs: G57.93

## 2020-09-17 HISTORY — DX: Other specified problems related to primary support group: Z63.8

## 2020-09-17 HISTORY — DX: Insomnia, unspecified: G47.00

## 2020-09-17 HISTORY — DX: Pilar cyst: L72.11

## 2020-09-17 HISTORY — DX: Body mass index (BMI) 39.0-39.9, adult: Z68.39

## 2020-09-17 HISTORY — DX: Palpitations: R00.2

## 2020-09-17 HISTORY — DX: Adjustment disorder with mixed anxiety and depressed mood: F43.23

## 2020-09-17 LAB — POCT GLYCOSYLATED HEMOGLOBIN (HGB A1C): Hemoglobin A1C: 7 % — AB (ref 4.0–5.6)

## 2020-09-17 MED ORDER — LORAZEPAM 1 MG PO TABS: 1.0000 mg | ORAL_TABLET | Freq: Every day | ORAL | 0 refills | Status: DC | PRN

## 2020-09-17 MED ORDER — ATORVASTATIN CALCIUM 40 MG PO TABS: 40.0000 mg | ORAL_TABLET | Freq: Every day | ORAL | 1 refills | Status: DC

## 2020-09-17 MED ORDER — GLIPIZIDE ER 10 MG PO TB24: 10.0000 mg | ORAL_TABLET | Freq: Two times a day (BID) | ORAL | 1 refills | Status: DC

## 2020-09-17 MED ORDER — METFORMIN HCL 1000 MG PO TABS: 1000.0000 mg | ORAL_TABLET | Freq: Two times a day (BID) | ORAL | 1 refills | Status: DC

## 2020-09-17 MED ORDER — OZEMPIC (0.25 OR 0.5 MG/DOSE) 2 MG/1.5ML ~~LOC~~ SOPN: 0.5000 mg | PEN_INJECTOR | SUBCUTANEOUS | 1 refills | Status: DC

## 2020-09-17 MED ORDER — SILDENAFIL CITRATE 20 MG PO TABS: 20.0000 mg | ORAL_TABLET | ORAL | 11 refills | Status: DC | PRN

## 2020-09-17 NOTE — Progress Notes (Signed)
Subjective:    CC: DM follow up  HPI:  DM- cut back carbs, getting started with exercise with a buddy. Taking Glipizide 10mg  BID, metformin 1,000mg  BID, and Ozempic 0.5mg  weekly. Tolerating all medications well. Checking sugars at home, notes much improved readings.   Foot pain- history of plantar fasciitis and has insoles that help with that. Has a different bilateral foot pain that feels like he is walking on rolled up socks. Making it difficult to walk long distances and do the required shopping and activities. Is interested in a handicap placard. Notes that he also has noted a thickening of one of his toenails at the end recently.   Nosebleeds- has been having daily nosebleeds, mostly after blowing his nose forcefully. Has chronic nasal congestion although he is already taking oral antihistamines and using saline sprays/rinses. Unclear what he is allergic to but has year round issues with allergic rhinitis. Can't go without blowing his nose due to the severity of the congestion.   Scalp cysts- has approximately 5 cysts on his scalp that have been present for years. Is interested in getting them removed and would like a referral to dermatology.   ED- has struggled with erectile dysfunction for many years and is interested in being treated with medication for it. Wonders if there is anything he can take that is not very expensive to see if it will work for him.   Anxiety- has difficulty turning his mind off to be able to fall asleep at night. Taking Lorazepam approximately one night per week when he is feeling significantly worried/anxious.   I reviewed the past medical history, family history, social history, surgical history, and allergies today and no changes were needed.  Please see the problem list section below in epic for further details.  Past Medical History: Past Medical History:  Diagnosis Date  . Allergy   . Anxiety   . Cardiac murmur 06/24/2020  . Cataract    beginning  stage in rt. eye  . Chest discomfort 06/24/2020  . Colon polyps   . Diabetes mellitus without complication ([place])   . DOE (dyspnea on exertion) 06/24/2020  . GERD (gastroesophageal reflux disease)   . Hyperlipidemia    states cholesterol normal states being given to protect kidneys  . Morbid obesity ([place]) 06/24/2020   Past Surgical History: Past Surgical History:  Procedure Laterality Date  . COLONOSCOPY W/ BIOPSIES AND POLYPECTOMY    . RETINAL DETACHMENT SURGERY     age 72  . WISDOM TOOTH EXTRACTION     Social History: Social History   Socioeconomic History  . Marital status: Married    Spouse name: Not on file  . Number of children: Not on file  . Years of education: Not on file  . Highest education level: Not on file  Occupational History  . Not on file  Tobacco Use  . Smoking status: Former Smoker    Types: Cigarettes  . Smokeless tobacco: Never Used  . Tobacco comment: quit at age 44  Vaping Use  . Vaping Use: Never used  Substance and Sexual Activity  . Alcohol use: Yes    Comment: Occasionally  . Drug use: No  . Sexual activity: Not Currently  Other Topics Concern  . Not on file  Social History Narrative  . Not on file   Social Determinants of Health   Financial Resource Strain: Not on file  Food Insecurity: Not on file  Transportation Needs: Not on file  Physical Activity: Not on  file  Stress: Not on file  Social Connections: Not on file   Family History: Family History  Problem Relation Age of Onset  . Parkinson's disease Father   . Colon cancer Neg Hx   . Stomach cancer Neg Hx   . Pancreatic cancer Neg Hx   . Esophageal cancer Neg Hx   . Rectal cancer Neg Hx    Allergies: Allergies  Allergen Reactions  . Penicillins Shortness Of Breath and Swelling  . Cephalexin Diarrhea   Medications: See med rec.  Review of Systems: See HPI for pertinent positives and negatives.   Objective:    General: Well Developed, well nourished, and in no  acute distress.  Neuro: Alert and oriented x3.  HEENT: Normocephalic, atraumatic.  Skin: Warm and dry. Cardiac: Regular rate and rhythm.  Respiratory: Not using accessory muscles, speaking in full sentences.  Impression and Recommendations:    1. Diabetes mellitus without complication (HCC) POCT [id number] 7.0%. Continue current medications as prescribed. Referring to Ophthalmology for diabetic eye exam. Referring to podiatry.  - POCT HgB A1C - Ambulatory referral to Ophthalmology - Ambulatory referral to Podiatry  2. Epistaxis Recommend avoiding forceful nose blowing. Continue nasal saline sprays. If bleeding prolonged and not responsive to typical efforts, consider using Afrin nasal spray and an ice pack over the bridge of the nose. Discussed rebound congestion with Afrin and to limit use.   3. Allergy, subsequent encounter Referring to Allergy for testing to discover what is causing his severe allergies and management recommendations.  - Ambulatory referral to Allergy  4. 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 Some improvement noted with recent lifestyle modifications supports the suspicion. Recommend supportive shoes along with insoles if this is helpful. Referring to podiatry for further evaluation and diabetic foot care.   5. Scalp cyst Referring to dermatology. - Ambulatory referral to Dermatology  6. Anxiety Refilling Lorazepam. Continue sparing use to prevent dependence.   7. Erectile dysfunction due to type 2 diabetes mellitus ([place]) Trialing sildenafil 20-100mg  daily as needed. Discussed precautions and safe use. Avoid nitrates while taking this medication. Recommend considering mechanical devices such as a vacuum or ring to help achieve and maintain erection.   Return in about 6 months (around 03/17/2021) for DM/chronic condition follow up. ___________________________________________ Clearnce Sorrel,  DNP, APRN, FNP-BC [facility] and [place]

## 2020-09-18 ENCOUNTER — Telehealth: Payer: Self-pay | Admitting: Neurology

## 2020-09-18 NOTE — Telephone Encounter (Signed)
Prior Authorization for Sildenafil submitted via covermymeds. Awaiting response.

## 2020-09-19 ENCOUNTER — Encounter: Payer: Self-pay | Admitting: Medical-Surgical

## 2020-09-19 ENCOUNTER — Other Ambulatory Visit: Payer: Self-pay

## 2020-09-19 MED ORDER — SILDENAFIL CITRATE 20 MG PO TABS: 20.0000 mg | ORAL_TABLET | ORAL | 11 refills | Status: DC | PRN

## 2020-09-25 NOTE — Telephone Encounter (Signed)
Prior authorization for Sildenafil denied by the insurance. Additional information will be provided in the denial communication. [place] has been updated.

## 2020-09-25 NOTE — Telephone Encounter (Signed)
Thank you for the update.  I believe he has picked this up as an out-of-pocket cost already.

## 2020-10-02 DIAGNOSIS — E119 Type 2 diabetes mellitus without complications: Secondary | ICD-10-CM | POA: Diagnosis not present

## 2020-10-02 DIAGNOSIS — H524 Presbyopia: Secondary | ICD-10-CM | POA: Diagnosis not present

## 2020-10-02 LAB — HM DIABETES EYE EXAM

## 2020-10-04 ENCOUNTER — Ambulatory Visit: Payer: BC Managed Care – PPO | Admitting: Podiatry

## 2020-10-15 DIAGNOSIS — L820 Inflamed seborrheic keratosis: Secondary | ICD-10-CM | POA: Diagnosis not present

## 2020-10-15 DIAGNOSIS — L7211 Pilar cyst: Secondary | ICD-10-CM | POA: Diagnosis not present

## 2020-10-25 ENCOUNTER — Encounter: Payer: Self-pay | Admitting: Podiatry

## 2020-10-25 ENCOUNTER — Other Ambulatory Visit: Payer: Self-pay

## 2020-10-25 ENCOUNTER — Ambulatory Visit: Payer: Medicare HMO | Admitting: Podiatry

## 2020-10-25 DIAGNOSIS — E1149 Type 2 diabetes mellitus with other diabetic neurological complication: Secondary | ICD-10-CM | POA: Diagnosis not present

## 2020-10-25 DIAGNOSIS — B351 Tinea unguium: Secondary | ICD-10-CM

## 2020-10-25 DIAGNOSIS — L600 Ingrowing nail: Secondary | ICD-10-CM

## 2020-10-25 DIAGNOSIS — Z79899 Other long term (current) drug therapy: Secondary | ICD-10-CM | POA: Diagnosis not present

## 2020-10-25 NOTE — Patient Instructions (Addendum)
You can start a "B-complex" vitamin to see if that will help with the neuropathy as well. If no help we can start a medication called "gabapentin".  Diabetes Mellitus and Foot Care Foot care is an important part of your health, especially when you have diabetes. Diabetes may cause you to have problems because of poor blood flow (circulation) to your feet and legs, which can cause your skin to:  Become thinner and drier.  Break more easily.  Heal more slowly.  Peel and crack. You may also have nerve damage (neuropathy) in your legs and feet, causing decreased feeling in them. This means that you may not notice minor injuries to your feet that could lead to more serious problems. Noticing and addressing any potential problems early is the best way to prevent future foot problems. How to care for your feet Foot hygiene  Wash your feet daily with warm water and mild soap. Do not use hot water. Then, pat your feet and the areas between your toes until they are completely dry. Do not soak your feet as this can dry your skin.  Trim your toenails straight across. Do not dig under them or around the cuticle. File the edges of your nails with an emery board or nail file.  Apply a moisturizing lotion or petroleum jelly to the skin on your feet and to dry, brittle toenails. Use lotion that does not contain alcohol and is unscented. Do not apply lotion between your toes.   Shoes and socks  Wear clean socks or stockings every day. Make sure they are not too tight. Do not wear knee-high stockings since they may decrease blood flow to your legs.  Wear shoes that fit properly and have enough cushioning. Always look in your shoes before you put them on to be sure there are no objects inside.  To break in new shoes, wear them for just a few hours a day. This prevents injuries on your feet. Wounds, scrapes, corns, and calluses  Check your feet daily for blisters, cuts, bruises, sores, and redness. If you  cannot see the bottom of your feet, use a mirror or ask someone for help.  Do not cut corns or calluses or try to remove them with medicine.  If you find a minor scrape, cut, or break in the skin on your feet, keep it and the skin around it clean and dry. You may clean these areas with mild soap and water. Do not clean the area with peroxide, alcohol, or iodine.  If you have a wound, scrape, corn, or callus on your foot, look at it several times a day to make sure it is healing and not infected. Check for: ? Redness, swelling, or pain. ? Fluid or blood. ? Warmth. ? Pus or a bad smell.   General tips  Do not cross your legs. This may decrease blood flow to your feet.  Do not use heating pads or hot water bottles on your feet. They may burn your skin. If you have lost feeling in your feet or legs, you may not know this is happening until it is too late.  Protect your feet from hot and cold by wearing shoes, such as at the beach or on hot pavement.  Schedule a complete foot exam at least once a year (annually) or more often if you have foot problems. Report any cuts, sores, or bruises to your health care provider immediately. Where to find more information  American Diabetes Association: www.diabetes.org  Association of Diabetes Care & Education Specialists: www.diabeteseducator.org Contact a health care provider if:  You have a medical condition that increases your risk of infection and you have any cuts, sores, or bruises on your feet.  You have an injury that is not healing.  You have redness on your legs or feet.  You feel burning or tingling in your legs or feet.  You have pain or cramps in your legs and feet.  Your legs or feet are numb.  Your feet always feel cold.  You have pain around any toenails. Get help right away if:  You have a wound, scrape, corn, or callus on your foot and: ? You have pain, swelling, or redness that gets worse. ? You have fluid or blood  coming from the wound, scrape, corn, or callus. ? Your wound, scrape, corn, or callus feels warm to the touch. ? You have pus or a bad smell coming from the wound, scrape, corn, or callus. ? You have a fever. ? You have a red line going up your leg. Summary  Check your feet every day for blisters, cuts, bruises, sores, and redness.  Apply a moisturizing lotion or petroleum jelly to the skin on your feet and to dry, brittle toenails.  Wear shoes that fit properly and have enough cushioning.  If you have foot problems, report any cuts, sores, or bruises to your health care provider immediately.  Schedule a complete foot exam at least once a year (annually) or more often if you have foot problems. This information is not intended to replace advice given to you by your health care provider. Make sure you discuss any questions you have with your health care provider. Document Revised: 03/14/2020 Document Reviewed: 03/14/2020 Elsevier Patient Education  2021 [place].  Terbinafine oral granules What is this medicine? TERBINAFINE (TER bin a feen) is an antifungal medicine. It is used to treat certain kinds of fungal or yeast infections. This medicine may be used for other purposes; ask your health care provider or pharmacist if you have questions. COMMON BRAND NAME(S): Lamisil What should I tell my health care provider before I take this medicine? They need to know if you have any of these conditions:  drink alcoholic beverages  kidney disease  liver disease  an unusual or allergic reaction to Terbinafine, other medicines, foods, dyes, or preservatives  pregnant or trying to get pregnant  breast-feeding How should I use this medicine? Take this medicine by mouth. Follow the directions on the prescription label. Hold packet with cut line on top. Shake packet gently to settle contents. Tear packet open along cut line, or use scissors to cut across line. Carefully pour the entire  contents of packet onto a spoonful of a soft food, such as pudding or other soft, non-acidic food such as mashed potatoes (do NOT use applesauce or a fruit-based food). If two packets are required for each dose, you may either sprinkle the content of both packets on one spoonful of non-acidic food, or sprinkle the contents of both packets on two spoonfuls of non-acidic food. Make sure that no granules remain in the packet. Swallow the mxiture of the food and granules without chewing. Take your medicine at regular intervals. Do not take it more often than directed. Take all of your medicine as directed even if you think you are better. Do not skip doses or stop your medicine early. Contact your pediatrician or health care professional regarding the use of this medicine in  children. While this medicine may be prescribed for children as young as 4 years for selected conditions, precautions do apply. Overdosage: If you think you have taken too much of this medicine contact a poison control center or emergency room at once. NOTE: This medicine is only for you. Do not share this medicine with others. What if I miss a dose? If you miss a dose, take it as soon as you can. If it is almost time for your next dose, take only that dose. Do not take double or extra doses. What may interact with this medicine? Do not take this medicine with any of the following medications:  thioridazine This medicine may also interact with the following medications:  beta-blockers  caffeine  cimetidine  cyclosporine  MAOIs like Carbex, Eldepryl, Marplan, Nardil, and Parnate  medicines for fungal infections like fluconazole and ketoconazole  medicines for irregular heartbeat like amiodarone, flecainide and propafenone  rifampin  SSRIs like citalopram, escitalopram, fluoxetine, fluvoxamine, paroxetine and sertraline  tricyclic antidepressants like amitriptyline, clomipramine, desipramine, imipramine, nortriptyline,  and others  warfarin This list may not describe all possible interactions. Give your health care provider a list of all the medicines, herbs, non-prescription drugs, or dietary supplements you use. Also tell them if you smoke, drink alcohol, or use illegal drugs. Some items may interact with your medicine. What should I watch for while using this medicine? Your doctor may monitor your liver function. Tell your doctor right away if you have nausea or vomiting, loss of appetite, stomach pain on your right upper side, yellow skin, dark urine, light stools, or are over tired. This medicine may cause serious skin reactions. They can happen weeks to months after starting the medicine. Contact your health care provider right away if you notice fevers or flu-like symptoms with a rash. The rash may be red or purple and then turn into blisters or peeling of the skin. Or, you might notice a red rash with swelling of the face, lips or lymph nodes in your neck or under your arms. You need to take this medicine for 6 weeks or longer to cure the fungal infection. Take your medicine regularly for as long as your doctor or health care provider tells you to. What side effects may I notice from receiving this medicine? Side effects that you should report to your doctor or health care professional as soon as possible:  allergic reactions like skin rash or hives, swelling of the face, lips, or tongue  change in vision  dark urine  fever or infection  general ill feeling or flu-like symptoms  light-colored stools  loss of appetite, nausea  rash, fever, and swollen lymph nodes  redness, blistering, peeling or loosening of the skin, including inside the mouth  right upper belly pain  unusually weak or tired  yellowing of the eyes or skin Side effects that usually do not require medical attention (report to your doctor or health care professional if they continue or are bothersome):  changes in  taste  diarrhea  hair loss  muscle or joint pain  stomach upset This list may not describe all possible side effects. Call your doctor for medical advice about side effects. You may report side effects to FDA at 1-800-FDA-1088. Where should I keep my medicine? Keep out of the reach of children. Store at room temperature between 15 and 30 degrees C (59 and 86 degrees F). Throw away any unused medicine after the expiration date. NOTE: This sheet is a summary.  It may not cover all possible information. If you have questions about this medicine, talk to your doctor, pharmacist, or health care provider.  2021 Elsevier/Gold Standard (2018-12-02 15:35:11)

## 2020-10-25 NOTE — Progress Notes (Signed)
Subjective:   Patient ID: Danny Morgan. Danny Morgan., male   DOB: 65 y.o.   MRN: [id number]   HPI 65 year old male presents the office today for diabetic foot evaluation.  He is also concerned about his right big toenail becoming may be ingrown.  He states he has had ingrown toenails before but never had a procedure.  Denies any drainage or pus any swelling.  The pain is only intermittently.  He also has concerns of feeling like socks are rolled under his foot.  This may ongoing about 1 year.  He also presents with numbness and tingling but this is improved since his blood sugars been better controlled.  Denies any ulcerations denies any claudication symptoms.  His last A1c was 7.0.  He has no other concerns today.   Review of Systems  All other systems reviewed and are negative.  Past Medical History:  Diagnosis Date  . Allergy   . Anxiety   . Cardiac murmur 06/24/2020  . Cataract    beginning stage in rt. eye  . Chest discomfort 06/24/2020  . Colon polyps   . Diabetes mellitus without complication ([place])   . DOE (dyspnea on exertion) 06/24/2020  . GERD (gastroesophageal reflux disease)   . Hyperlipidemia    states cholesterol normal states being given to protect kidneys  . Morbid obesity ([place]) 06/24/2020    Past Surgical History:  Procedure Laterality Date  . COLONOSCOPY W/ BIOPSIES AND POLYPECTOMY    . RETINAL DETACHMENT SURGERY     age 74  . WISDOM TOOTH EXTRACTION       Current Outpatient Medications:  .  albuterol (VENTOLIN HFA) 108 (90 Base) MCG/ACT inhaler, Inhale 2 puffs into the lungs every 4 (four) hours as needed., Disp: , Rfl:  .  atorvastatin (LIPITOR) 40 MG tablet, Take 1 tablet (40 mg total) by mouth daily., Disp: 90 tablet, Rfl: 1 .  Black Pepper-Turmeric (TURMERIC CURCUMIN) 01-999 MG CAPS, daily., Disp: , Rfl:  .  cetirizine (ZYRTEC) 10 MG tablet, Take 10 mg by mouth daily., Disp: , Rfl:  .  Cholecalciferol (VITAMIN D PO), Take by mouth daily., Disp: , Rfl:  .   glipiZIDE (GLUCOTROL XL) 10 MG 24 hr tablet, Take 1 tablet (10 mg total) by mouth 2 (two) times daily., Disp: 180 tablet, Rfl: 1 .  hydrocortisone (ANUSOL-HC) 25 MG suppository, Place 1 suppository (25 mg total) rectally 2 (two) times daily as needed for hemorrhoids., Disp: 24 suppository, Rfl: 2 .  loratadine (CLARITIN) 10 MG tablet, Take 1 tablet by mouth daily. , Disp: , Rfl:  .  LORazepam (ATIVAN) 1 MG tablet, Take 1 tablet (1 mg total) by mouth daily as needed for anxiety., Disp: 30 tablet, Rfl: 0 .  losartan (COZAAR) 25 MG tablet, Take 25 mg by mouth daily., Disp: , Rfl:  .  metFORMIN (GLUCOPHAGE) 1000 MG tablet, Take 1 tablet (1,000 mg total) by mouth 2 (two) times daily., Disp: 180 tablet, Rfl: 1 .  Multiple Minerals-Vitamins (BONE ESSENTIALS) CAPS, daily., Disp: , Rfl:  .  Omega-3 Fatty Acids (FISH OIL) 1200 MG CAPS, daily., Disp: , Rfl:  .  omeprazole (PRILOSEC) 10 MG capsule, Take 10 mg by mouth daily. , Disp: , Rfl:  .  OZEMPIC, 0.25 OR 0.5 MG/DOSE, 2 MG/1.5ML SOPN, Inject 0.5 mg into the skin once a week., Disp: 4.5 mL, Rfl: 1 .  sildenafil (REVATIO) 20 MG tablet, Take 1-5 tablets (20-100 mg total) by mouth as needed., Disp: 50 tablet, Rfl: 11 .  Vit C-Cholecalciferol-Rose Hip (VITAMIN C & D3/ROSE HIPS) [phone] MG-UNIT-MG CAPS, daily., Disp: , Rfl:  .  Zinc 30 MG CAPS, daily., Disp: , Rfl:   Current Facility-Administered Medications:  .  0.9 %  sodium chloride infusion, 500 mL, Intravenous, Continuous, Armbruster, Carlota Raspberry, MD  Allergies  Allergen Reactions  . Penicillins Shortness Of Breath and Swelling  . Cephalexin Diarrhea          Objective:  Physical Exam  General: AAO x3, NAD  Dermatological: Nails are hypertrophic, dystrophic with yellow-brown discoloration.  No edema, erythema or signs of infection of the toenail sites.  Mild mild incurvation present to medial aspect of right hallux toenail distally without signs of infection.  There is no open  lesions.  Vascular: Dorsalis Pedis artery and Posterior Tibial artery pedal pulses are 2/4 bilateral with immedate capillary fill time. There is no pain with calf compression, swelling, warmth, erythema.   Neruologic: Grossly intact via light touch bilateral.  Sensation appears to be intact with Thornell Mule monofilament  Musculoskeletal: No area of tenderness identified bilaterally.  Muscular strength 5/5 in all groups tested bilateral.  Gait: Unassisted, Nonantalgic.       Assessment:   65 year old male with onychomycosis, type 2 diabetes with neuropathy     Plan:  -Treatment options discussed including all alternatives, risks, and complications -Etiology of symptoms were discussed  1.  Onychomycosis -As a courtesy debrided the nails x10 without any complications or bleeding -Discussed treatment options and wants to proceed with oral treatment.  Discussed side effects as well as success rates.  Order CBC and LFT prior to starting the medication.  2.  Ingrown toenail -Debrided the medial border distally without any complications or bleeding.  Monitoring signs or symptoms of infection  3.  Neuropathy -Discussed adding other medications including gabapentin we held off on this.  Recommended B complex vitamin and glucose control.  Trula Slade DPM

## 2020-10-26 LAB — CBC WITH DIFFERENTIAL/PLATELET
Absolute Monocytes: 640 cells/uL (ref 200–950)
Basophils Absolute: 48 cells/uL (ref 0–200)
Basophils Relative: 0.6 %
Eosinophils Absolute: 536 cells/uL — ABNORMAL HIGH (ref 15–500)
Eosinophils Relative: 6.7 %
HCT: 48.2 % (ref 38.5–50.0)
Hemoglobin: 15.8 g/dL (ref 13.2–17.1)
Lymphs Abs: 1680 cells/uL (ref 850–3900)
MCH: 26.7 pg — ABNORMAL LOW (ref 27.0–33.0)
MCHC: 32.8 g/dL (ref 32.0–36.0)
MCV: 81.4 fL (ref 80.0–100.0)
MPV: 11.5 fL (ref 7.5–12.5)
Monocytes Relative: 8 %
Neutro Abs: 5096 cells/uL (ref 1500–7800)
Neutrophils Relative %: 63.7 %
Platelets: 252 10*3/uL (ref 140–400)
RBC: 5.92 10*6/uL — ABNORMAL HIGH (ref 4.20–5.80)
RDW: 13.1 % (ref 11.0–15.0)
Total Lymphocyte: 21 %
WBC: 8 10*3/uL (ref 3.8–10.8)

## 2020-10-26 LAB — HEPATIC FUNCTION PANEL
AG Ratio: 1.9 (calc) (ref 1.0–2.5)
ALT: 24 U/L (ref 9–46)
AST: 18 U/L (ref 10–35)
Albumin: 4.5 g/dL (ref 3.6–5.1)
Alkaline phosphatase (APISO): 73 U/L (ref 35–144)
Bilirubin, Direct: 0.1 mg/dL (ref 0.0–0.2)
Globulin: 2.4 g/dL (calc) (ref 1.9–3.7)
Indirect Bilirubin: 0.2 mg/dL (calc) (ref 0.2–1.2)
Total Bilirubin: 0.3 mg/dL (ref 0.2–1.2)
Total Protein: 6.9 g/dL (ref 6.1–8.1)

## 2020-11-01 ENCOUNTER — Other Ambulatory Visit: Payer: Self-pay | Admitting: Podiatry

## 2020-11-01 DIAGNOSIS — Z79899 Other long term (current) drug therapy: Secondary | ICD-10-CM

## 2020-11-01 DIAGNOSIS — L7211 Pilar cyst: Secondary | ICD-10-CM | POA: Diagnosis not present

## 2020-11-01 MED ORDER — TERBINAFINE HCL 250 MG PO TABS: 250.0000 mg | ORAL_TABLET | Freq: Every day | ORAL | 0 refills | Status: DC

## 2020-11-06 ENCOUNTER — Encounter: Payer: Self-pay | Admitting: Allergy

## 2020-11-06 ENCOUNTER — Other Ambulatory Visit: Payer: Self-pay

## 2020-11-06 ENCOUNTER — Ambulatory Visit: Payer: Medicare HMO | Admitting: Allergy

## 2020-11-06 VITALS — BP 142/70 | HR 69 | Temp 98.0°F | Resp 20 | Ht 70.12 in | Wt 277.6 lb

## 2020-11-06 DIAGNOSIS — H1013 Acute atopic conjunctivitis, bilateral: Secondary | ICD-10-CM

## 2020-11-06 DIAGNOSIS — J3089 Other allergic rhinitis: Secondary | ICD-10-CM | POA: Diagnosis not present

## 2020-11-06 MED ORDER — MONTELUKAST SODIUM 10 MG PO TABS: 10.0000 mg | ORAL_TABLET | Freq: Every day | ORAL | 5 refills | Status: DC

## 2020-11-06 MED ORDER — IPRATROPIUM BROMIDE 0.06 % NA SOLN
NASAL | 5 refills | Status: DC
Start: 2020-11-06 — End: 2021-02-06

## 2020-11-06 NOTE — Progress Notes (Signed)
New Patient Note  RE: Danny Morgan. Danny Morgan. MRN: [id number] DOB: 03/15/1956 Date of Office Visit: 11/06/2020  Referring provider: Samuel Bouche, NP Primary care provider: Samuel Bouche, NP  Chief Complaint: allergies  History of present illness: Danny Morgan. Danny Morgan. is a 65 y.o. male presenting today for consultation for allergic rhinitis.   He has had issues with his sinuses for many years now.  He currently reports symptoms of nasal congestion, nasal drainage with throat clearing, sneezing, itchy/watery eyes and occasional ear itch. Symptoms are worse in spring and fall but are year-round. He is taking 2 antihistamine medications that are not helping.  Using generic zyrtec and claritin together daily for years now.  He states at the beginning the antihistamines were helpful and they still help a little bit as he reports increase in symptoms these past 3 days he has been off.  He has used flonase in the past and feels it doesn't work.  Sometimes will use systane eye drop.  When he first moved to [facility] around 2012 he states his allergies worsened.  He had 7 sinus infections around that time in 14 months span.  He states the infections were related to CPAP use which he stopped and has not had any more infections since then.   No history of asthma, food allergy or eczema.   Review of systems: Review of Systems  Constitutional: Negative.   HENT:       See HPI  Eyes:       See HPI  Respiratory: Negative.   Cardiovascular: Negative.   Gastrointestinal: Negative.   Musculoskeletal: Negative.   Skin: Negative.   Neurological: Negative.     All other systems negative unless noted above in HPI  Past medical history: Past Medical History:  Diagnosis Date  . Allergy   . Angio-edema   . Anxiety   . Cardiac murmur 06/24/2020  . Cataract    beginning stage in rt. eye  . Chest discomfort 06/24/2020  . Colon polyps   . Diabetes mellitus without complication ([place])   . DOE (dyspnea on  exertion) 06/24/2020  . Eczema   . GERD (gastroesophageal reflux disease)   . Hyperlipidemia    states cholesterol normal states being given to protect kidneys  . Morbid obesity ([place]) 06/24/2020    Past surgical history: Past Surgical History:  Procedure Laterality Date  . COLONOSCOPY W/ BIOPSIES AND POLYPECTOMY    . RETINAL DETACHMENT SURGERY     age 34  . WISDOM TOOTH EXTRACTION      Family history:  Family History  Problem Relation Age of Onset  . Parkinson's disease Father   . Colon cancer Neg Hx   . Stomach cancer Neg Hx   . Pancreatic cancer Neg Hx   . Esophageal cancer Neg Hx   . Rectal cancer Neg Hx   . Allergic rhinitis Neg Hx   . Asthma Neg Hx   . Eczema Neg Hx   . Urticaria Neg Hx     Social history: Lives in a home with carpeting in the family room with wood heating and central and heat pump cooling.  Cat in the home.  There is concern for water damage or mildew in the home.  No concern for roaches in the home.  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  He denies smoking history.  Medication List: Current Outpatient Medications  Medication Sig Dispense Refill  . atorvastatin (LIPITOR) 40 MG tablet Take  1 tablet (40 mg total) by mouth daily. 90 tablet 1  . Black Pepper-Turmeric (TURMERIC CURCUMIN) 01-999 MG CAPS daily.    . cetirizine (ZYRTEC) 10 MG tablet Take 10 mg by mouth daily.    . Cholecalciferol (VITAMIN D PO) Take by mouth daily.    Marland Kitchen glipiZIDE (GLUCOTROL XL) 10 MG 24 hr tablet Take 1 tablet (10 mg total) by mouth 2 (two) times daily. 180 tablet 1  . hydrocortisone (ANUSOL-HC) 25 MG suppository Place 1 suppository (25 mg total) rectally 2 (two) times daily as needed for hemorrhoids. 24 suppository 2  . loratadine (CLARITIN) 10 MG tablet Take 1 tablet by mouth daily.     Marland Kitchen LORazepam (ATIVAN) 1 MG tablet Take 1 tablet (1 mg total) by mouth daily as needed for anxiety. 30 tablet 0  . losartan (COZAAR) 25 MG tablet Take 25 mg by  mouth daily.    . metFORMIN (GLUCOPHAGE) 1000 MG tablet Take 1 tablet (1,000 mg total) by mouth 2 (two) times daily. 180 tablet 1  . Multiple Minerals-Vitamins (BONE ESSENTIALS) CAPS daily.    . Omega-3 Fatty Acids (FISH OIL) 1200 MG CAPS daily.    Marland Kitchen omeprazole (PRILOSEC) 10 MG capsule Take 10 mg by mouth daily.     Marland Kitchen OZEMPIC, 0.25 OR 0.5 MG/DOSE, 2 MG/1.5ML SOPN Inject 0.5 mg into the skin once a week. 4.5 mL 1  . Vit C-Cholecalciferol-Rose Hip (VITAMIN C & D3/ROSE HIPS) [phone] MG-UNIT-MG CAPS daily.    . Zinc 30 MG CAPS daily.     Current Facility-Administered Medications  Medication Dose Route Frequency Provider Last Rate Last Admin  . 0.9 %  sodium chloride infusion  500 mL Intravenous Continuous Armbruster, Carlota Raspberry, MD        Known medication allergies: Allergies  Allergen Reactions  . Penicillins Shortness Of Breath and Swelling  . Cephalexin Diarrhea     Physical examination: Blood pressure (!) 142/70, pulse 69, temperature 98 F (36.7 C), temperature source Temporal, resp. rate 20, height 5' 10.12" (1.781 m), weight 277 lb 9.6 oz (125.9 kg), SpO2 97 %.  General: Alert, interactive, in no acute distress. HEENT: TMs pearly gray, turbinates moderately edematous without discharge, post-pharynx non erythematous. Neck: Supple without lymphadenopathy. Lungs: Clear to auscultation without wheezing, rhonchi or rales. {no increased work of breathing. CV: Normal S1, S2 without murmurs. Abdomen: Nondistended, nontender. Skin: Warm and dry, without lesions or rashes. Extremities:  No clubbing, cyanosis or edema. Neuro:   Grossly intact.  Diagnositics/Labs:  Allergy testing: Environmental allergy skin prick testing is positive to [place]. Intradermal testing is positive to mold mix 1 and 4. Allergy testing results were read and interpreted by provider, documented by clinical staff.   Assessment and plan: Allergic rhinitis with conjunctivitis  -Environmental allergy  testing is positive to tree pollen and mold -Allergen avoidance measures discussed/handouts provided -Stop Cetirizine and Loratadine -Try Levocetirizine (Xyzal) 5 mg daily.  This is a long-acting antihistamine similar to cetirizine and loratadine it may be more effective at this time. -Start Singulair 10 mg daily at bedtime.  This is not an antihistamine but an antileukotriene that can help in allergy symptom control alongside antihistamines.  If you notice any change in mood/behavior/sleep after starting Singulair then stop this medication and let us know.  Symptoms resolve after stopping the medication.   - for nasal congestion and drainage control recommend trial of nasal Atrovent 2 sprays each nostril 1-2 times a day (can use up to 3-4 times a day) -  Reserve use of Nostrilla (Oxymetazoline), a nasal decongestant, for times of severe nasal congestion where you cannot breathe through either nostril.  Use for no longer than 3 to 5 days at a time as chronic use can lead to worsening congestion upon stopping this type of nasal spray. -For itchy, watery eyes can use over-the-counter Pataday 1 drop each eye daily as needed -Allergen immunotherapy discussed today including protocol, benefits and risk.  Informational handout provided.  If interested in this therapuetic option you can check with your insurance carrier for coverage.  Let us know if you would like to proceed with this option.  Follow-up in 4 months or sooner if needed  I appreciate the opportunity to take part in Nickolai's care. Please do not hesitate to contact me with questions.  Sincerely,   Prudy Feeler, MD Allergy/Immunology [facility]

## 2020-11-06 NOTE — Patient Instructions (Addendum)
-  Environmental allergy testing is positive to tree pollen and mold -Allergen avoidance measures discussed/handouts provided -Stop Cetirizine and Loratadine -Try Levocetirizine (Xyzal) 5 mg daily.  This is a long-acting antihistamine similar to cetirizine and loratadine it may be more effective at this time. -Start Singulair 10 mg daily at bedtime.  This is not an antihistamine but an antileukotriene that can help in allergy symptom control alongside antihistamines.  If you notice any change in mood/behavior/sleep after starting Singulair then stop this medication and let us know.  Symptoms resolve after stopping the medication.   - for nasal congestion and drainage control recommend trial of nasal Atrovent 2 sprays each nostril 1-2 times a day (can use up to 3-4 times a day) -Reserve use of Nostrilla (Oxymetazoline), a nasal decongestant, for times of severe nasal congestion where you cannot breathe through either nostril.  Use for no longer than 3 to 5 days at a time as chronic use can lead to worsening congestion upon stopping this type of nasal spray. -For itchy, watery eyes can use over-the-counter Pataday 1 drop each eye daily as needed -Allergen immunotherapy discussed today including protocol, benefits and risk.  Informational handout provided.  If interested in this therapuetic option you can check with your insurance carrier for coverage.  Let us know if you would like to proceed with this option.  Follow-up in 4 months or sooner if needed

## 2020-11-11 ENCOUNTER — Telehealth: Payer: Self-pay

## 2020-11-11 NOTE — Telephone Encounter (Signed)
LVM letting him know that it would be best to bring him in to see Joy first. Instructed him TRC to schedule an OV with Joy. (1st attempt)  Pt called and LVM regarding a possible referral to ENT and Urology. Pt states he is having nose bleeds out of his left nostril every morning. He is thinking he may need to see ENT and possibly have the nostril cauterized. He is also wanting to know about getting a referral to Urology. He states the sildenafil is not working for him. He is thinking he may do have an increased dose or be on something else. He said he did not know if Joy could do something to help or if needed to be directly referred to ENT and Urology.

## 2020-11-12 ENCOUNTER — Encounter: Payer: Self-pay | Admitting: Medical-Surgical

## 2020-11-12 ENCOUNTER — Ambulatory Visit (INDEPENDENT_AMBULATORY_CARE_PROVIDER_SITE_OTHER): Payer: Medicare HMO | Admitting: Medical-Surgical

## 2020-11-12 ENCOUNTER — Other Ambulatory Visit: Payer: Self-pay

## 2020-11-12 VITALS — BP 142/79 | HR 76 | Temp 98.3°F | Ht 71.0 in | Wt 273.6 lb

## 2020-11-12 DIAGNOSIS — N521 Erectile dysfunction due to diseases classified elsewhere: Secondary | ICD-10-CM | POA: Diagnosis not present

## 2020-11-12 DIAGNOSIS — E1169 Type 2 diabetes mellitus with other specified complication: Secondary | ICD-10-CM | POA: Diagnosis not present

## 2020-11-12 DIAGNOSIS — R040 Epistaxis: Secondary | ICD-10-CM

## 2020-11-12 HISTORY — DX: Epistaxis: R04.0

## 2020-11-12 HISTORY — DX: Type 2 diabetes mellitus with other specified complication: E11.69

## 2020-11-12 MED ORDER — TADALAFIL 20 MG PO TABS: 20.0000 mg | ORAL_TABLET | Freq: Every day | ORAL | 1 refills | Status: DC | PRN
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
## 2020-11-13 NOTE — Telephone Encounter (Signed)
Pt had an OV with Samuel Bouche, FNP on 11/12/20 in which referral orders were placed.

## 2020-11-19 ENCOUNTER — Encounter: Payer: Self-pay | Admitting: Medical-Surgical

## 2020-12-04 DIAGNOSIS — R040 Epistaxis: Secondary | ICD-10-CM | POA: Diagnosis not present

## 2020-12-04 DIAGNOSIS — J019 Acute sinusitis, unspecified: Secondary | ICD-10-CM | POA: Diagnosis not present

## 2020-12-13 ENCOUNTER — Telehealth: Payer: Self-pay

## 2020-12-13 NOTE — Telephone Encounter (Signed)
Pt called and said that taking tadalafil 20 mg was not working for him. He is wanting to know if it would be safe to double the dose to 40 mg. Please advise.

## 2020-12-13 NOTE — Telephone Encounter (Signed)
No. Taking more than 20mg  daily is not recommended and can risk cardiac complications. Would recommend that he see Urology for further measures since the tadalafil and sildenafil is not helping. If he is okay with this, I'll be glad to put in a referral. If he has a preference for Urology, please let me know so I can put the referral in correctly.

## 2020-12-13 NOTE — Telephone Encounter (Signed)
Pt aware of Joy's response. He states he has an appt with Urology already set up on 12/19/2020.

## 2020-12-19 DIAGNOSIS — R351 Nocturia: Secondary | ICD-10-CM | POA: Diagnosis not present

## 2020-12-19 DIAGNOSIS — N401 Enlarged prostate with lower urinary tract symptoms: Secondary | ICD-10-CM | POA: Diagnosis not present

## 2020-12-19 DIAGNOSIS — Z125 Encounter for screening for malignant neoplasm of prostate: Secondary | ICD-10-CM | POA: Diagnosis not present

## 2020-12-19 DIAGNOSIS — R350 Frequency of micturition: Secondary | ICD-10-CM | POA: Diagnosis not present

## 2020-12-19 DIAGNOSIS — N5201 Erectile dysfunction due to arterial insufficiency: Secondary | ICD-10-CM | POA: Diagnosis not present

## 2021-01-09 DIAGNOSIS — N5201 Erectile dysfunction due to arterial insufficiency: Secondary | ICD-10-CM | POA: Diagnosis not present

## 2021-01-24 ENCOUNTER — Ambulatory Visit: Payer: Medicare HMO | Admitting: Podiatry

## 2021-02-06 ENCOUNTER — Ambulatory Visit: Payer: Medicare HMO | Admitting: Cardiology

## 2021-02-06 ENCOUNTER — Encounter: Payer: Self-pay | Admitting: Cardiology

## 2021-02-06 ENCOUNTER — Other Ambulatory Visit: Payer: Self-pay

## 2021-02-06 VITALS — BP 126/72 | HR 80 | Ht 71.0 in | Wt 261.0 lb

## 2021-02-06 DIAGNOSIS — E782 Mixed hyperlipidemia: Secondary | ICD-10-CM

## 2021-02-06 DIAGNOSIS — E669 Obesity, unspecified: Secondary | ICD-10-CM | POA: Diagnosis not present

## 2021-02-06 DIAGNOSIS — E119 Type 2 diabetes mellitus without complications: Secondary | ICD-10-CM | POA: Diagnosis not present

## 2021-02-06 NOTE — Progress Notes (Signed)
Cardiology Office Note:    Date:  02/06/2021   ID:  Danny Morgan. Danny Natal., DOB 06/29/1956, MRN [id number]  PCP:  Samuel Bouche, NP  Cardiologist:  Jenean Lindau, MD   Referring MD: Samuel Bouche, NP    ASSESSMENT:    1. Mixed hyperlipidemia   2. Type 2 diabetes mellitus without complication, without long-term current use of insulin (HCC)   3. Obesity (BMI 35.0-39.9 without comorbidity)    PLAN:    In order of problems listed above:  1. Primary prevention stressed with the patient.  Importance of compliance with diet medication stressed any vocalized understanding.  He was advised to walk at least half an hour a day on a regular basis 5 days a week and he promises to do so. 2. Mixed dyslipidemia: Diet was emphasized.  Lipids were reviewed and is on statin therapy and they are doing fine. 3. Diabetes mellitus: Managed by primary care.  I reviewed the KPN sheet and found hemoglobin A1c to be elevated and I discussed and cautioned him about it. 4. Obesity: Risks of obesity explained.  He has predominant abdominal obesity and he promises to do better. 5. Patient will be seen in follow-up appointment in 6 months or earlier if the patient has any concerns    Medication Adjustments/Labs and Tests Ordered: Current medicines are reviewed at length with the patient today.  Concerns regarding medicines are outlined above.  No orders of the defined types were placed in this encounter.  No orders of the defined types were placed in this encounter.    No chief complaint on file.    History of Present Illness:    Danny Morgan. Danny Savant. is a 65 y.o. male.  Patient has past medical history of mixed dyslipidemia and diabetes mellitus.  He denies any problems at this time.  He is overweight and obese.  He mentions to me that he has taken good care of his diet and exercise and has lost significant amount of weight in the past few months.  He is happy about it.  At the time of my evaluation, the  patient is alert awake oriented and in no distress.  Past Medical History:  Diagnosis Date  . Allergy   . Angio-edema   . Anxiety   . Cardiac murmur 06/24/2020  . Cataract    beginning stage in rt. eye  . Chest discomfort 06/24/2020  . Colon polyps   . Diabetes mellitus without complication ([place])   . DOE (dyspnea on exertion) 06/24/2020  . Eczema   . GERD (gastroesophageal reflux disease)   . Hyperlipidemia    states cholesterol normal states being given to protect kidneys  . Morbid obesity ([place]) 06/24/2020    Past Surgical History:  Procedure Laterality Date  . COLONOSCOPY W/ BIOPSIES AND POLYPECTOMY    . RETINAL DETACHMENT SURGERY     age 63  . WISDOM TOOTH EXTRACTION      Current Medications: Current Meds  Medication Sig  . atorvastatin (LIPITOR) 40 MG tablet Take 1 tablet (40 mg total) by mouth daily.  Renard Hamper Pepper-Turmeric (TURMERIC CURCUMIN) 01-999 MG CAPS Take 1 tablet by mouth daily.  . cetirizine (ZYRTEC) 10 MG tablet Take 10 mg by mouth daily.  Marland Kitchen glipiZIDE (GLUCOTROL XL) 10 MG 24 hr tablet Take 1 tablet (10 mg total) by mouth 2 (two) times daily.  Marland Kitchen ipratropium (ATROVENT) 0.06 % nasal spray Place 2 sprays into both nostrils 2 (two) times daily.  Marland Kitchen LORazepam (ATIVAN)  1 MG tablet Take 1 tablet (1 mg total) by mouth daily as needed for anxiety.  . metFORMIN (GLUCOPHAGE) 1000 MG tablet Take 1 tablet (1,000 mg total) by mouth 2 (two) times daily.  . montelukast (SINGULAIR) 10 MG tablet Take 1 tablet (10 mg total) by mouth at bedtime.  . Omega-3 Fatty Acids (FISH OIL) 1200 MG CAPS Take 1,200 mg by mouth daily.  Marland Kitchen omeprazole (PRILOSEC) 10 MG capsule Take 10 mg by mouth daily.   Marland Kitchen OZEMPIC, 0.25 OR 0.5 MG/DOSE, 2 MG/1.5ML SOPN Inject 0.5 mg into the skin once a week.  . tadalafil (CIALIS) 20 MG tablet Take 1 tablet (20 mg total) by mouth daily as needed for erectile dysfunction.  . Vit C-Cholecalciferol-Rose Hip (VITAMIN C & D3/ROSE HIPS) [phone] MG-UNIT-MG CAPS  Take 1 tablet by mouth daily.  . Zinc 30 MG CAPS Take 30 mg by mouth daily.  . [DISCONTINUED] Cholecalciferol (VITAMIN D PO) Take by mouth daily.     Allergies:   Penicillins and Cephalexin   Social History   Socioeconomic History  . Marital status: Married    Spouse name: Not on file  . Number of children: Not on file  . Years of education: Not on file  . Highest education level: Not on file  Occupational History  . Not on file  Tobacco Use  . Smoking status: Former Smoker    Types: Cigarettes  . Smokeless tobacco: Never Used  . Tobacco comment: quit at age 37  Vaping Use  . Vaping Use: Never used  Substance and Sexual Activity  . Alcohol use: Yes    Comment: Occasionally  . Drug use: No  . Sexual activity: Not Currently  Other Topics Concern  . Not on file  Social History Narrative  . Not on file   Social Determinants of Health   Financial Resource Strain: Not on file  Food Insecurity: Not on file  Transportation Needs: Not on file  Physical Activity: Not on file  Stress: Not on file  Social Connections: Not on file     Family History: The patient's family history includes Parkinson's disease in his father. There is no history of Colon cancer, Stomach cancer, Pancreatic cancer, Esophageal cancer, Rectal cancer, Allergic rhinitis, Asthma, Eczema, or Urticaria.  ROS:   Please see the history of present illness.    All other systems reviewed and are negative.  EKGs/Labs/Other Studies Reviewed:    The following studies were reviewed today: I discussed my findings with the patient at extensive length.   Recent Labs: 06/17/2020: BUN 20; Creat 1.02; Potassium 4.2; Sodium 137 10/25/2020: ALT 24; Hemoglobin 15.8; Platelets 252  Recent Lipid Panel    Component Value Date/Time   CHOL 103 06/17/2020 1108   TRIG 90 06/17/2020 1108   HDL 34 (L) 06/17/2020 1108   CHOLHDL 3.0 06/17/2020 1108   LDLCALC 52 06/17/2020 1108    Physical Exam:    VS:  BP 126/72    Pulse 80   Ht 5\' 11"  (1.803 m)   Wt 261 lb (118.4 kg)   SpO2 96%   BMI 36.40 kg/m     Wt Readings from Last 3 Encounters:  02/06/21 261 lb (118.4 kg)  11/12/20 273 lb 9.6 oz (124.1 kg)  11/06/20 277 lb 9.6 oz (125.9 kg)     GEN: Patient is in no acute distress HEENT: Normal NECK: No JVD; No carotid bruits LYMPHATICS: No lymphadenopathy CARDIAC: Hear sounds regular, 2/6 systolic murmur at the apex. RESPIRATORY:  Clear to auscultation without rales, wheezing or rhonchi  ABDOMEN: Soft, non-tender, non-distended MUSCULOSKELETAL:  No edema; No deformity  SKIN: Warm and dry NEUROLOGIC:  Alert and oriented x 3 PSYCHIATRIC:  Normal affect   Signed, Jenean Lindau, MD  02/06/2021 4:28 PM    [facility]

## 2021-02-06 NOTE — Patient Instructions (Signed)

## 2021-02-14 DIAGNOSIS — J3489 Other specified disorders of nose and nasal sinuses: Secondary | ICD-10-CM | POA: Diagnosis not present

## 2021-02-14 DIAGNOSIS — R040 Epistaxis: Secondary | ICD-10-CM | POA: Diagnosis not present

## 2021-02-14 DIAGNOSIS — J358 Other chronic diseases of tonsils and adenoids: Secondary | ICD-10-CM | POA: Diagnosis not present

## 2021-03-12 ENCOUNTER — Ambulatory Visit: Payer: Medicare HMO | Admitting: Allergy

## 2021-03-17 ENCOUNTER — Encounter: Payer: Self-pay | Admitting: Medical-Surgical

## 2021-03-17 ENCOUNTER — Other Ambulatory Visit: Payer: Self-pay

## 2021-03-17 ENCOUNTER — Ambulatory Visit (INDEPENDENT_AMBULATORY_CARE_PROVIDER_SITE_OTHER): Payer: Medicare HMO | Admitting: Medical-Surgical

## 2021-03-17 VITALS — BP 138/76 | HR 72 | Temp 99.0°F | Ht 71.0 in | Wt 261.0 lb

## 2021-03-17 DIAGNOSIS — N521 Erectile dysfunction due to diseases classified elsewhere: Secondary | ICD-10-CM

## 2021-03-17 DIAGNOSIS — E1149 Type 2 diabetes mellitus with other diabetic neurological complication: Secondary | ICD-10-CM | POA: Diagnosis not present

## 2021-03-17 DIAGNOSIS — F5104 Psychophysiologic insomnia: Secondary | ICD-10-CM | POA: Diagnosis not present

## 2021-03-17 DIAGNOSIS — F419 Anxiety disorder, unspecified: Secondary | ICD-10-CM

## 2021-03-17 DIAGNOSIS — J329 Chronic sinusitis, unspecified: Secondary | ICD-10-CM | POA: Diagnosis not present

## 2021-03-17 DIAGNOSIS — E119 Type 2 diabetes mellitus without complications: Secondary | ICD-10-CM

## 2021-03-17 DIAGNOSIS — R69 Illness, unspecified: Secondary | ICD-10-CM | POA: Diagnosis not present

## 2021-03-17 DIAGNOSIS — E1169 Type 2 diabetes mellitus with other specified complication: Secondary | ICD-10-CM

## 2021-03-17 DIAGNOSIS — J40 Bronchitis, not specified as acute or chronic: Secondary | ICD-10-CM | POA: Diagnosis not present

## 2021-03-17 LAB — POCT GLYCOSYLATED HEMOGLOBIN (HGB A1C): Hemoglobin A1C: 6.6 % — AB (ref 4.0–5.6)

## 2021-03-17 MED ORDER — AZITHROMYCIN 250 MG PO TABS: ORAL_TABLET | ORAL | 0 refills | Status: AC

## 2021-03-17 MED ORDER — TADALAFIL 20 MG PO TABS: 20.0000 mg | ORAL_TABLET | Freq: Every day | ORAL | 1 refills | Status: DC | PRN

## 2021-03-17 MED ORDER — AMITRIPTYLINE HCL 25 MG PO TABS: 25.0000 mg | ORAL_TABLET | Freq: Every day | ORAL | 0 refills | Status: DC

## 2021-03-17 NOTE — Progress Notes (Signed)
Subjective:    CC: DM/ED/mood follow-up  HPI: Pleasant 65 year old male presenting today for the following:  Diabetes-checking blood sugars at home with most readings between 140s to 170s.  He has been following a lower calorie diet and avoiding concentrated sweets and sodas.  He has lost approximately 30 pounds since this time last year and is planning to continue his goal for weight loss.  Erectile dysfunction-has tried Viagra which was not helpful.  Notes that tadalafil 20 mg used as needed does provide some assistance with erections.  He has not tried rings or pump/vacuum devices.  He has been to urology and was provided with a shot that did help some but not to the extent as his erections during his earlier years.  Mood-has reached a critical point with anxiety and depression.  Has difficulty with his relationship with his wife and children.  Increase in-home stress as well as financial.  Just found out that his Social Security income was being cut off because he works too many hours.  He is now significantly worried about being able to pay the bills and support his family.  He is having difficulty sleeping.  Notes that he feels socially isolated.  Is interested in counseling but unsure if he will be able to afford it with his new financial issues.  Sinus congestion-had a recent illness that he described as a chemical induced cold a couple weeks ago.  He has been congested with a productive cough since then.  His symptoms do not seem to be improving or resolving on their own.  No fever, chills, shortness of breath, chest pain, or GI symptoms  I reviewed the past medical history, family history, social history, surgical history, and allergies today and no changes were needed.  Please see the problem list section below in epic for further details.  Past Medical History: Past Medical History:  Diagnosis Date   Allergy    Angio-edema    Anxiety    Cardiac murmur 06/24/2020   Cataract     beginning stage in rt. eye   Chest discomfort 06/24/2020   Colon polyps    Diabetes mellitus without complication (HCC)    DOE (dyspnea on exertion) 06/24/2020   Eczema    GERD (gastroesophageal reflux disease)    Hyperlipidemia    states cholesterol normal states being given to protect kidneys   Morbid obesity ([place]) 06/24/2020   Past Surgical History: Past Surgical History:  Procedure Laterality Date   COLONOSCOPY W/ BIOPSIES AND POLYPECTOMY     RETINAL DETACHMENT SURGERY     age 54   WISDOM TOOTH EXTRACTION     Social History: Social History   Socioeconomic History   Marital status: Married    Spouse name: Not on file   Number of children: Not on file   Years of education: Not on file   Highest education level: Not on file  Occupational History   Not on file  Tobacco Use   Smoking status: Former    Pack years: 0.00    Types: Cigarettes   Smokeless tobacco: Never   Tobacco comments:    quit at age 64  Vaping Use   Vaping Use: Never used  Substance and Sexual Activity   Alcohol use: Yes    Comment: Occasionally   Drug use: No   Sexual activity: Not Currently  Other Topics Concern   Not on file  Social History Narrative   Not on file   Social Determinants of Health  Financial Resource Strain: Not on file  Food Insecurity: Not on file  Transportation Needs: Not on file  Physical Activity: Not on file  Stress: Not on file  Social Connections: Not on file   Family History: Family History  Problem Relation Age of Onset   Parkinson's disease Father    Colon cancer Neg Hx    Stomach cancer Neg Hx    Pancreatic cancer Neg Hx    Esophageal cancer Neg Hx    Rectal cancer Neg Hx    Allergic rhinitis Neg Hx    Asthma Neg Hx    Eczema Neg Hx    Urticaria Neg Hx    Allergies: Allergies  Allergen Reactions   Penicillins Shortness Of Breath and Swelling   Cephalexin Diarrhea   Medications: See med rec.  Review of Systems: See HPI for pertinent  positives and negatives.   Objective:    General: Well Developed, well nourished, and in no acute distress.  Neuro: Alert and oriented x3.  HEENT: Normocephalic, atraumatic.  Skin: Warm and dry. Cardiac: Regular rate and rhythm, no murmurs rubs or gallops, no lower extremity edema.  Respiratory: Clear to auscultation bilaterally. Not using accessory muscles, speaking in full sentences.  Impression and Recommendations:    1. 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(HCC) POCT hemoglobin A1c 6.6%.  Continue metformin and Ozempic as prescribed.  With his change in financial situation, referring to [place] monitoring for discussion of possible patient assistance to help afford medications - POCT glycosylated hemoglobin (Hb A1C)  2. Anxiety 3. Psychophysiological insomnia Referring to behavioral health for counseling.  Discussed various options for treatment.  Patient is open to medication so we will start amitriptyline that should help with mood as well as sleep.  Starting at low-dose of 25 mg nightly.  I will send him a message in 3-4 weeks to evaluate response and tolerance. - Ambulatory referral to [place]  4. Erectile dysfunction due to type 2 diabetes mellitus (HCC) Continue tadalafil 20 mg daily as needed.  Discussed adding reading and/or vacuum devices to aid with satisfactory erections.  5. Sinobronchitis Azithromycin 500 mg today followed by 250 mg daily x4 days.  No follow-ups on file. ___________________________________________ Clearnce Sorrel, DNP, APRN, FNP-BC Primary Care and [place]

## 2021-04-14 ENCOUNTER — Encounter: Payer: Self-pay | Admitting: Medical-Surgical

## 2021-04-16 ENCOUNTER — Other Ambulatory Visit: Payer: Self-pay | Admitting: Medical-Surgical

## 2021-04-16 DIAGNOSIS — E782 Mixed hyperlipidemia: Secondary | ICD-10-CM

## 2021-04-16 DIAGNOSIS — E1149 Type 2 diabetes mellitus with other diabetic neurological complication: Secondary | ICD-10-CM

## 2021-04-20 ENCOUNTER — Other Ambulatory Visit: Payer: Self-pay | Admitting: Medical-Surgical

## 2021-04-23 ENCOUNTER — Encounter: Payer: Self-pay | Admitting: Medical-Surgical

## 2021-04-23 ENCOUNTER — Other Ambulatory Visit: Payer: Self-pay

## 2021-04-23 ENCOUNTER — Telehealth: Payer: Self-pay | Admitting: *Deleted

## 2021-04-23 ENCOUNTER — Ambulatory Visit (INDEPENDENT_AMBULATORY_CARE_PROVIDER_SITE_OTHER): Payer: Medicare HMO | Admitting: Medical-Surgical

## 2021-04-23 DIAGNOSIS — F419 Anxiety disorder, unspecified: Secondary | ICD-10-CM | POA: Diagnosis not present

## 2021-04-23 DIAGNOSIS — R69 Illness, unspecified: Secondary | ICD-10-CM | POA: Diagnosis not present

## 2021-04-23 DIAGNOSIS — F4323 Adjustment disorder with mixed anxiety and depressed mood: Secondary | ICD-10-CM

## 2021-04-23 MED ORDER — MELOXICAM 15 MG PO TABS: 15.0000 mg | ORAL_TABLET | Freq: Every day | ORAL | 0 refills | Status: DC

## 2021-04-23 MED ORDER — PREDNISONE 50 MG PO TABS: 50.0000 mg | ORAL_TABLET | Freq: Every day | ORAL | 0 refills | Status: DC
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
## 2021-04-23 NOTE — Chronic Care Management (AMB) (Signed)
  Chronic Care Management   Note  04/23/2021 Name: Danny Morgan. Danny Morgan. MRN: [id number] DOB: 17-Feb-1956  Danny Morgan. Danny Morgan. is a 65 y.o. year old male who is a primary care patient of Danny Bouche, Danny Morgan. I reached out to [place] D. Danny Morgan. by phone today in response to a referral sent by [name]. Danny Shan Jr.'s PCP Danny Bouche, Danny Morgan     Danny Morgan was given information about Chronic Care Management services today including:  CCM service includes personalized support from designated clinical staff supervised by his physician, including individualized plan of care and coordination with other care providers 24/7 contact phone numbers for assistance for urgent and routine care needs. Service will only be billed when office clinical staff spend 20 minutes or more in a month to coordinate care. Only one practitioner may furnish and bill the service in a calendar month. The patient may stop CCM services at any time (effective at the end of the month) by phone call to the office staff. The patient will be responsible for cost sharing (co-pay) of up to 20% of the service fee (after annual deductible is met).  Patient agreed to services and verbal consent obtained.   Follow up plan: Telephone appointment with care management team member scheduled for: 05/01/2021  Danny Morgan, [facility]  Direct Dial: [phone]

## 2021-04-29 ENCOUNTER — Telehealth: Payer: Self-pay | Admitting: Pharmacist
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
## 2021-05-01 ENCOUNTER — Other Ambulatory Visit: Payer: Self-pay

## 2021-05-01 ENCOUNTER — Ambulatory Visit (INDEPENDENT_AMBULATORY_CARE_PROVIDER_SITE_OTHER): Payer: Medicare HMO | Admitting: Pharmacist

## 2021-05-01 DIAGNOSIS — E1149 Type 2 diabetes mellitus with other diabetic neurological complication: Secondary | ICD-10-CM | POA: Diagnosis not present

## 2021-05-01 DIAGNOSIS — E782 Mixed hyperlipidemia: Secondary | ICD-10-CM | POA: Diagnosis not present

## 2021-05-01 DIAGNOSIS — F419 Anxiety disorder, unspecified: Secondary | ICD-10-CM
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
## 2021-05-02 NOTE — Patient Instructions (Signed)
Mr. Authement,  I recommend calling [facility] at [phone] to see if they are taking new patients for couples OR individual therapy. Another option is [facility] at [phone]. Hope you feel better soon. See you in September for [place] to complete paperwork for Ozempic cost assistance.  Bryson Ha  Visit Information   PATIENT GOALS:   Goals Addressed             This Visit's Progress    Medication Management       Patient Goals/Self-Care Activities Over the next 14 days, patient will:  take medications as prescribed and collaborate with provider on medication access solutions  Follow Up Plan: Face to Face appointment with care management team member scheduled for: 2 weeks to complete paperwork for ozempic coverage         Consent to CCM Services: Mr. Fiero was given information about Chronic Care Management services including:  CCM service includes personalized support from designated clinical staff supervised by his physician, including individualized plan of care and coordination with other care providers 24/7 contact phone numbers for assistance for urgent and routine care needs. Service will only be billed when office clinical staff spend 20 minutes or more in a month to coordinate care. Only one practitioner may furnish and bill the service in a calendar month. The patient may stop CCM services at any time (effective at the end of the month) by phone call to the office staff. The patient will be responsible for cost sharing (co-pay) of up to 20% of the service fee (after annual deductible is met).  Patient agreed to services and verbal consent obtained.   Patient verbalizes understanding of instructions provided today and agrees to view in [place].   Face to Face appointment with care management team member scheduled for:  2 weeks  CLINICAL CARE PLAN: Patient Care Plan: Medication Management     Problem Identified: HLD, DM,  mental health      Long-Range Goal: Disease Progression Prevention   Start Date: 05/01/2021  This Visit's Progress: On track  Priority: High  Note:   Current Barriers:  Unable to independently afford treatment regimen  Pharmacist Clinical Goal(s):  Over the next 14 days, patient will verbalize ability to afford treatment regimen through collaboration with PharmD and provider.   Interventions: 1:1 collaboration with Samuel Bouche, NP regarding development and update of comprehensive plan of care as evidenced by provider attestation and co-signature Inter-disciplinary care team collaboration (see longitudinal plan of care) Comprehensive medication review performed; medication list updated in electronic medical record  Diabetes:  Uncontrolled/controlled; current treatment:glipizide 46m BID, metformin 10077mBID, ozempic 0.41m67meekly; a1c 6.6  Current glucose readings: not currently checking  Denies/reports hypoglycemic/hyperglycemic symptoms  Current meal patterns: to be discussed at future visits  Current exercise: renovating a house, still working  Recommended increase to ozempic 1mg73mekly (will initiate dose increase with patient assistance)  Assessed patient finances. Will begin patient assistance for ozempic via novonordisk.  Hyperlipidemia:  Controlled; current treatment:atorvastatin 40mg10mly; LDL 52  Recommended continue current regimen  Mental Health  Uncontrolled; current treatment: ativan PRN anxiety, amitryptyline 241mg 44msleep  Recommended patient get connected with couples and/or individual therapy, as well as consider maintenance medication such as SSRI.   Patient Goals/Self-Care Activities Over the next 14 days, patient will:  take medications as prescribed and collaborate with provider on medication access solutions  Follow Up Plan: Face to Face appointment with care management team member scheduled for:  2 weeks to complete paperwork for ozempic coverage

## 2021-05-21 ENCOUNTER — Ambulatory Visit (INDEPENDENT_AMBULATORY_CARE_PROVIDER_SITE_OTHER): Payer: Medicare HMO | Admitting: Pharmacist

## 2021-05-21 DIAGNOSIS — E1149 Type 2 diabetes mellitus with other diabetic neurological complication: Secondary | ICD-10-CM

## 2021-05-21 DIAGNOSIS — F419 Anxiety disorder, unspecified: Secondary | ICD-10-CM

## 2021-05-21 DIAGNOSIS — E782 Mixed hyperlipidemia: Secondary | ICD-10-CM

## 2021-05-21 NOTE — Patient Instructions (Signed)
Visit Information  PATIENT GOALS:  Goals Addressed             This Visit's Progress    Medication Management       Patient Goals/Self-Care Activities Over the next 30 days, patient will:  take medications as prescribed and collaborate with provider on medication access solutions  Follow Up Plan: Telephone appointment with care management team member scheduled for: 1 month        Patient verbalizes understanding of instructions provided today and agrees to view in Danny Morgan.   Telephone follow up appointment with care management team member scheduled for: 1 month  Darius Bump

## 2021-05-21 NOTE — Progress Notes (Signed)
Chronic Care Management Pharmacy Note  05/21/2021 Name:  Danny Morgan. Danny Morgan. MRN:  [id number] DOB:  04-May-1956  Summary: addressed mental health, HLD, and primarily DM.  Recommendations/Changes made from today's visit: Provided paperwork, patient will return completed forms for financial coverage of ozempic. Needs ozempic '1mg'$  samples if he runs out of medication prior to patient assistance coverage.  Plan: f/u with pharmacist in 1 month  Subjective: [name]. [name]. is an 65 y.o. year old male who is a primary patient of Samuel Bouche, NP.  The CCM team was consulted for assistance with disease management and care coordination needs.    Engaged with patient face to face for follow up visit in response to provider referral for pharmacy case management and/or care coordination services.   Consent to Services:  The patient was given information about Chronic Care Management services, agreed to services, and gave verbal consent prior to initiation of services.  Please see initial visit note for detailed documentation.   Patient Care Team: Samuel Bouche, NP as PCP - General (Nurse Practitioner) Michael Boston, MD as Consulting Physician (General Surgery) Darius Bump, [facility] (Pharmacist)   Objective:  Lab Results  Component Value Date   CREATININE 1.02 06/17/2020    Lab Results  Component Value Date   HGBA1C 6.6 (A) 03/17/2021   Last diabetic Eye exam:  Lab Results  Component Value Date/Time   HMDIABEYEEXA No Retinopathy 10/02/2020 12:00 AM    Last diabetic Foot exam: No results found for: HMDIABFOOTEX      Component Value Date/Time   CHOL 103 06/17/2020 1108   TRIG 90 06/17/2020 1108   HDL 34 (L) 06/17/2020 1108   CHOLHDL 3.0 06/17/2020 1108   [place] 52 06/17/2020 1108    Hepatic Function Latest Ref Rng & Units 10/25/2020 06/17/2020  Total Protein 6.1 - 8.1 g/dL 6.9 6.8  AST 10 - 35 U/L 18 17  ALT 9 - 46 U/L 24 25  Total Bilirubin 0.2 - 1.2 mg/dL 0.3 0.5   Bilirubin, Direct 0.0 - 0.2 mg/dL 0.1 -    No results found for: TSH, FREET4  CBC Latest Ref Rng & Units 10/25/2020 07/30/2020 06/17/2020  WBC 3.8 - 10.8 Thousand/uL 8.0 10.5 8.7  Hemoglobin 13.2 - 17.1 g/dL 15.8 15.6 16.1  Hematocrit 38.5 - 50.0 % 48.2 46.8 48.8  Platelets 140 - 400 Thousand/uL 252 277 266   Social History   Tobacco Use  Smoking Status Former   Types: Cigarettes  Smokeless Tobacco Never  Tobacco Comments   quit at age 33   BP Readings from Last 3 Encounters:  04/23/21 116/68  03/17/21 138/76  02/06/21 126/72   Pulse Readings from Last 3 Encounters:  04/23/21 74  03/17/21 72  02/06/21 80   Wt Readings from Last 3 Encounters:  04/23/21 254 lb (115.2 kg)  03/17/21 261 lb (118.4 kg)  02/06/21 261 lb (118.4 kg)    Assessment: Review of patient past medical history, allergies, medications, health status, including review of consultants reports, laboratory and other test data, was performed as part of comprehensive evaluation and provision of chronic care management services.   SDOH:  (Social Determinants of Health) assessments and interventions performed:    CCM Care Plan  Allergies  Allergen Reactions   Penicillins Shortness Of Breath and Swelling   Cephalexin Diarrhea    Medications Reviewed Today     Reviewed by Darius Bump, [facility] (Pharmacist) on 05/01/21 at [place] List Status: <None>  Medication Order Taking? Sig Documenting Provider Last Dose Status Informant  amitriptyline (ELAVIL) 25 MG tablet [id number] Yes Take 1 tablet (25 mg total) by mouth at bedtime. Samuel Bouche, NP Taking Active   atorvastatin (LIPITOR) 40 MG tablet [id number] Yes Take 1 tablet (40 mg total) by mouth daily. Samuel Bouche, NP Taking Active   Biotin 5000 MCG TABS [id number] Yes Take 1 tablet by mouth daily. [provider] Taking Active   Black Pepper-Turmeric (TURMERIC CURCUMIN) 01-999 MG CAPS [id number] Yes Take 1 tablet by mouth daily. [provider] Taking Active   cetirizine (ZYRTEC) 10 MG tablet [id number] Yes Take 10 mg by mouth daily. [provider] Taking Active   Echinacea 125 MG TABS [id number] Yes Take 1 tablet by mouth daily. [provider] Taking Active   glipiZIDE (GLUCOTROL XL) 10 MG 24 hr tablet [id number] Yes TAKE 1 TABLET BY MOUTH TWICE A DAY Jessup, Joy, NP Taking Active   ipratropium (ATROVENT) 0.06 % nasal spray [id number] Yes Place 2 sprays into both nostrils 2 (two) times daily. [provider] Taking Active   LORazepam (ATIVAN) 1 MG tablet [id number] Yes Take 1 tablet (1 mg total) by mouth daily as needed for anxiety. Samuel Bouche, NP Taking Active   meloxicam (MOBIC) 15 MG tablet [id number] Yes Take 1 tablet (15 mg total) by mouth daily. Samuel Bouche, NP Taking Active   metFORMIN (GLUCOPHAGE) 1000 MG tablet [id number]  Take 1 tablet (1,000 mg total) by mouth 2 (two) times daily. Samuel Bouche, NP  Active   montelukast (SINGULAIR) 10 MG tablet [id number] Yes Take 1 tablet (10 mg total) by mouth at bedtime. Kennith Gain, MD Taking Active   Omega-3 Fatty Acids (FISH OIL) 1200 MG CAPS [id number] No Take 1,200 mg by mouth daily.  Patient not taking: Reported on 05/01/2021   [provider] Not Taking Active   omeprazole (PRILOSEC) 10 MG capsule [id number] Yes Take 10 mg by mouth daily.  [provider] Taking Active   OZEMPIC, 0.25 OR 0.5 MG/DOSE, 2 MG/1.5ML SOPN [id number] Yes Inject 0.5 mg into the skin once a week. Samuel Bouche, NP Taking Active            Med Note Tonny Bollman May 01, 2021  9:26 AM) Dewaine Conger Sundays  predniSONE (DELTASONE) 50 MG tablet [id number] Yes Take 1 tablet (50 mg total) by mouth daily. Samuel Bouche, NP Taking Active   tadalafil (CIALIS) 20 MG tablet [id number] Yes Take 1 tablet (20 mg total) by mouth daily as needed for erectile dysfunction. Samuel Bouche, NP Taking Active   Vit C-Cholecalciferol-Rose Hip (VITAMIN C & D3/ROSE HIPS) [phone]  MG-UNIT-MG CAPS [id number] Yes Take 1 tablet by mouth daily. [provider] Taking Active   Zinc 30 MG CAPS [id number] Yes Take 30 mg by mouth daily. [provider] Taking Active             Patient Active Problem List   Diagnosis Date Noted   Obesity (BMI 35.0-39.9 without comorbidity) 02/06/2021   Epistaxis 11/12/2020   Erectile dysfunction due to type 2 diabetes mellitus ([place]) 11/12/2020   Adjustment disorder with mixed anxiety and depressed mood 09/17/2020   Body mass index (BMI) 39.0-39.9, adult 09/17/2020   Insomnia 09/17/2020   Neuropathy of both feet 09/17/2020   Palpitations 09/17/2020   Pilar cyst 09/17/2020   Stress due to family tension 09/17/2020   Tubular adenoma of colon 09/17/2020   DOE (dyspnea on exertion) 06/24/2020  Chest discomfort 06/24/2020   Morbid obesity ([place]) 06/24/2020   Cardiac murmur 06/24/2020   Allergy    Anxiety    Cataract    Colon polyps    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(HCC)    GERD (gastroesophageal reflux disease)    Hyperlipidemia     Immunization History  Administered Date(s) Administered   PFIZER(Purple Top)SARS-COV-2 Vaccination 01/29/2020, 02/20/2020    Conditions to be addressed/monitored: HLD, DMII, and Depression  There are no care plans that you recently modified to display for this patient.   Medication Assistance:  TBD  Patient's preferred pharmacy is:  [id number] - [place], [place] - [place] [address] Phone: [phone] Fax: [phone]  [id number] - [place], [place] - [place] [address] Phone: [phone] Fax: [place] [address] [place] [address] [place] [address] Phone: [phone] Fax: [phone]  Uses pill box? Yes Pt endorses 100% compliance  Follow Up:  Patient agrees to Care  Plan and Follow-up.  Plan: Telephone follow up appointment with care management team member scheduled for:  1 month  Darius Bump

## 2021-05-22 ENCOUNTER — Other Ambulatory Visit: Payer: Self-pay | Admitting: Allergy

## 2021-05-22 ENCOUNTER — Other Ambulatory Visit: Payer: Self-pay | Admitting: Medical-Surgical

## 2021-05-25 ENCOUNTER — Other Ambulatory Visit: Payer: Self-pay | Admitting: Medical-Surgical

## 2021-05-28 ENCOUNTER — Other Ambulatory Visit: Payer: Self-pay | Admitting: Medical-Surgical

## 2021-06-06 DIAGNOSIS — E782 Mixed hyperlipidemia: Secondary | ICD-10-CM | POA: Diagnosis not present

## 2021-06-06 DIAGNOSIS — E1149 Type 2 diabetes mellitus with other diabetic neurological complication: Secondary | ICD-10-CM

## 2021-06-12 ENCOUNTER — Telehealth: Payer: Medicare HMO

## 2021-06-12 NOTE — Progress Notes (Deleted)
Chronic Care Management Pharmacy Note  06/12/2021 Name:  Tyrail Morgan. Danny Morgan. MRN:  [id number] DOB:  [birth year]  Summary: addressed mental health, HLD, and primarily DM.  Recommendations/Changes made from today's visit: Provided paperwork, patient will return completed forms for financial coverage of ozempic. Needs ozempic 1mg  samples if he runs out of medication prior to patient assistance coverage.  Plan: f/u with pharmacist in 1 month  Subjective: Danny Morgan. Danny Morgan. is an 65 y.o. year old male who is a primary patient of Samuel Bouche, NP.  The CCM team was consulted for assistance with disease management and care coordination needs.    Engaged with patient face to face for follow up visit in response to provider referral for pharmacy case management and/or care coordination services.   Consent to Services:  The patient was given information about Chronic Care Management services, agreed to services, and gave verbal consent prior to initiation of services.  Please see initial visit note for detailed documentation.   Patient Care Team: Samuel Bouche, NP as PCP - General (Nurse Practitioner) Michael Boston, MD as Consulting Physician (General Surgery) Darius Bump, [facility] (Pharmacist)   Objective:  Lab Results  Component Value Date   CREATININE 1.02 06/17/2020    Lab Results  Component Value Date   HGBA1C 6.6 (A) 03/17/2021   Last diabetic Eye exam:  Lab Results  Component Value Date/Time   HMDIABEYEEXA No Retinopathy 10/02/2020 12:00 AM    Last diabetic Foot exam: No results found for: HMDIABFOOTEX      Component Value Date/Time   CHOL 103 06/17/2020 1108   TRIG 90 06/17/2020 1108   HDL 34 (L) 06/17/2020 1108   CHOLHDL 3.0 06/17/2020 1108   [place] 52 06/17/2020 1108    Hepatic Function Latest Ref Rng & Units 10/25/2020 06/17/2020  Total Protein 6.1 - 8.1 g/dL 6.9 6.8  AST 10 - 35 U/L 18 17  ALT 9 - 46 U/L 24 25  Total Bilirubin 0.2 - 1.2 mg/dL 0.3 0.5   Bilirubin, Direct 0.0 - 0.2 mg/dL 0.1 -    No results found for: TSH, FREET4  CBC Latest Ref Rng & Units 10/25/2020 07/30/2020 06/17/2020  WBC 3.8 - 10.8 Thousand/uL 8.0 10.5 8.7  Hemoglobin 13.2 - 17.1 g/dL 15.8 15.6 16.1  Hematocrit 38.5 - 50.0 % 48.2 46.8 48.8  Platelets 140 - 400 Thousand/uL 252 277 266   Social History   Tobacco Use  Smoking Status Former   Types: Cigarettes  Smokeless Tobacco Never  Tobacco Comments   quit at age 17   BP Readings from Last 3 Encounters:  04/23/21 116/68  03/17/21 138/76  02/06/21 126/72   Pulse Readings from Last 3 Encounters:  04/23/21 74  03/17/21 72  02/06/21 80   Wt Readings from Last 3 Encounters:  04/23/21 254 lb (115.2 kg)  03/17/21 261 lb (118.4 kg)  02/06/21 261 lb (118.4 kg)    Assessment: Review of patient past medical history, allergies, medications, health status, including review of consultants reports, laboratory and other test data, was performed as part of comprehensive evaluation and provision of chronic care management services.   SDOH:  (Social Determinants of Health) assessments and interventions performed:    CCM Care Plan  Allergies  Allergen Reactions   Penicillins Shortness Of Breath and Swelling   Cephalexin Diarrhea    Medications Reviewed Today     Reviewed by Darius Bump, [facility] (Pharmacist) on 05/01/21 at [place] List Status: <None>  Medication Order Taking? Sig Documenting Provider Last Dose Status Informant  amitriptyline (ELAVIL) 25 MG tablet [id number] Yes Take 1 tablet (25 mg total) by mouth at bedtime. Samuel Bouche, NP Taking Active   atorvastatin (LIPITOR) 40 MG tablet [id number] Yes Take 1 tablet (40 mg total) by mouth daily. Samuel Bouche, NP Taking Active   Biotin 5000 MCG TABS [id number] Yes Take 1 tablet by mouth daily. [provider] Taking Active   Black Pepper-Turmeric (TURMERIC CURCUMIN) 01-999 MG CAPS [id number] Yes Take 1 tablet by mouth daily. [provider] Taking Active   cetirizine (ZYRTEC) 10 MG tablet [id number] Yes Take 10 mg by mouth daily. [provider] Taking Active   Echinacea 125 MG TABS [id number] Yes Take 1 tablet by mouth daily. [provider] Taking Active   glipiZIDE (GLUCOTROL XL) 10 MG 24 hr tablet [id number] Yes TAKE 1 TABLET BY MOUTH TWICE A DAY Jessup, Joy, NP Taking Active   ipratropium (ATROVENT) 0.06 % nasal spray [id number] Yes Place 2 sprays into both nostrils 2 (two) times daily. [provider] Taking Active   LORazepam (ATIVAN) 1 MG tablet [id number] Yes Take 1 tablet (1 mg total) by mouth daily as needed for anxiety. Samuel Bouche, NP Taking Active   meloxicam (MOBIC) 15 MG tablet [id number] Yes Take 1 tablet (15 mg total) by mouth daily. Samuel Bouche, NP Taking Active   metFORMIN (GLUCOPHAGE) 1000 MG tablet [id number]  Take 1 tablet (1,000 mg total) by mouth 2 (two) times daily. Samuel Bouche, NP  Active   montelukast (SINGULAIR) 10 MG tablet [id number] Yes Take 1 tablet (10 mg total) by mouth at bedtime. Kennith Gain, MD Taking Active   Omega-3 Fatty Acids (FISH OIL) 1200 MG CAPS [id number] No Take 1,200 mg by mouth daily.  Patient not taking: Reported on 05/01/2021   [provider] Not Taking Active   omeprazole (PRILOSEC) 10 MG capsule [id number] Yes Take 10 mg by mouth daily.  [provider] Taking Active   OZEMPIC, 0.25 OR 0.5 MG/DOSE, 2 MG/1.5ML SOPN [id number] Yes Inject 0.5 mg into the skin once a week. Samuel Bouche, NP Taking Active            Med Note Tonny Bollman May 01, 2021  9:26 AM) Dewaine Conger Sundays  predniSONE (DELTASONE) 50 MG tablet [id number] Yes Take 1 tablet (50 mg total) by mouth daily. Samuel Bouche, NP Taking Active   tadalafil (CIALIS) 20 MG tablet [id number] Yes Take 1 tablet (20 mg total) by mouth daily as needed for erectile dysfunction. Samuel Bouche, NP Taking Active   Vit C-Cholecalciferol-Rose Hip (VITAMIN C & D3/ROSE HIPS) [phone]  MG-UNIT-MG CAPS [id number] Yes Take 1 tablet by mouth daily. [provider] Taking Active   Zinc 30 MG CAPS [id number] Yes Take 30 mg by mouth daily. [provider] Taking Active             Patient Active Problem List   Diagnosis Date Noted   Obesity (BMI 35.0-39.9 without comorbidity) 02/06/2021   Epistaxis 11/12/2020   Erectile dysfunction due to type 2 diabetes mellitus ([place]) 11/12/2020   Adjustment disorder with mixed anxiety and depressed mood 09/17/2020   Body mass index (BMI) 39.0-39.9, adult 09/17/2020   Insomnia 09/17/2020   Neuropathy of both feet 09/17/2020   Palpitations 09/17/2020   Pilar cyst 09/17/2020   Stress due to family tension 09/17/2020   Tubular adenoma of colon 09/17/2020   DOE (dyspnea on exertion) 06/24/2020  Chest discomfort 06/24/2020   Morbid obesity ([place]) 06/24/2020   Cardiac murmur 06/24/2020   Allergy    Anxiety    Cataract    Colon polyps    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(HCC)    GERD (gastroesophageal reflux disease)    Hyperlipidemia     Immunization History  Administered Date(s) Administered   PFIZER(Purple Top)SARS-COV-2 Vaccination 01/29/2020, 02/20/2020    Conditions to be addressed/monitored: HLD, DMII, and Depression  There are no care plans that you recently modified to display for this patient.   Medication Assistance:  TBD  Patient's preferred pharmacy is:  [id number] - [place], [place] - [place] [address] Phone: [phone] Fax: [phone]  [id number] - [place], [place] - [place] [address] Phone: [phone] Fax: [place] [address] [place] [address] [place] [address] Phone: [phone] Fax: [phone]  Uses pill box? Yes Pt endorses 100% compliance  Follow Up:  Patient agrees to Care  Plan and Follow-up.  Plan: Telephone follow up appointment with care management team member scheduled for:  1 month  Darius Bump

## 2021-06-13 ENCOUNTER — Other Ambulatory Visit: Payer: Self-pay | Admitting: Medical-Surgical

## 2021-06-19 ENCOUNTER — Other Ambulatory Visit: Payer: Self-pay | Admitting: Medical-Surgical

## 2021-06-19 ENCOUNTER — Telehealth: Payer: Self-pay

## 2021-06-19 NOTE — Telephone Encounter (Signed)
Called patient and LVM that for any further refills on meds he will need to have labs done, but did not see current lab orders for patient. Please Advise. AM

## 2021-06-19 NOTE — Telephone Encounter (Signed)
Please call pt and remind him that he needs to have labs done prior to any further refills.  Charyl Bigger, CMA

## 2021-06-24 ENCOUNTER — Other Ambulatory Visit: Payer: Self-pay | Admitting: Medical-Surgical

## 2021-06-24 ENCOUNTER — Telehealth: Payer: Self-pay

## 2021-06-24 NOTE — Telephone Encounter (Signed)
Medication: amitriptyline (ELAVIL) 25 MG tablet Prior authorization submitted via CoverMyMeds on 06/24/2021 [place] submission pending

## 2021-07-04 NOTE — Telephone Encounter (Signed)
Medication: amitriptyline (ELAVIL) 25 MG tablet Prior authorization determination received Medication has been denied Reason for denial:  "Your plan does not allow coverage of this medication based on your prescriber answering No to the following question(s): The [place] identifies the use of this medication as potentially inappropriate in older adults, meaning it is best avoided, prescribed at reduced dosage, or used with caution or carefully monitored.  Is the requested drug being prescribed for the treatment of neuropathic pain?   The [place] identifies the use of this medication as potentially inappropriate in older adults, meaning it is best avoided, prescribed at reduced dosage, or used with caution or carefully monitored.  Is the requested drug being prescribed for the treatment of depression?"   Appeal filed via [employer]

## 2021-07-06 ENCOUNTER — Other Ambulatory Visit: Payer: Self-pay | Admitting: Medical-Surgical

## 2021-07-07 ENCOUNTER — Other Ambulatory Visit: Payer: Self-pay

## 2021-07-07 DIAGNOSIS — E785 Hyperlipidemia, unspecified: Secondary | ICD-10-CM

## 2021-07-07 NOTE — Telephone Encounter (Signed)
Medication: amitriptyline (ELAVIL) 25 MG tablet Prior authorization determination received Medication has been approved Approval dates: 07/07/2021-09/06/2022  Patient aware via: [place] aware: Yes Provider aware via this encounter

## 2021-07-07 NOTE — Progress Notes (Signed)
Lipid lab for medication refill

## 2021-07-26 ENCOUNTER — Other Ambulatory Visit: Payer: Self-pay | Admitting: Medical-Surgical

## 2021-08-06 ENCOUNTER — Encounter: Payer: Self-pay | Admitting: Medical-Surgical

## 2021-08-06 ENCOUNTER — Ambulatory Visit (INDEPENDENT_AMBULATORY_CARE_PROVIDER_SITE_OTHER): Payer: Medicare HMO | Admitting: Medical-Surgical

## 2021-08-06 VITALS — BP 147/74 | HR 74 | Resp 20 | Ht 71.0 in | Wt 253.0 lb

## 2021-08-06 DIAGNOSIS — H00036 Abscess of eyelid left eye, unspecified eyelid: Secondary | ICD-10-CM

## 2021-08-06 MED ORDER — SULFAMETHOXAZOLE-TRIMETHOPRIM 800-160 MG PO TABS: 1.0000 | ORAL_TABLET | Freq: Two times a day (BID) | ORAL | 0 refills | Status: DC

## 2021-08-06 MED ORDER — ERYTHROMYCIN 5 MG/GM OP OINT: 1.0000 "application " | TOPICAL_OINTMENT | Freq: Three times a day (TID) | OPHTHALMIC | 0 refills | Status: AC

## 2021-08-06 MED ORDER — SEMAGLUTIDE (1 MG/DOSE) 4 MG/3ML ~~LOC~~ SOPN
1.0000 mg | PEN_INJECTOR | SUBCUTANEOUS | 1 refills | Status: DC
Start: 2021-08-06 — End: 2021-12-09

## 2021-08-06 NOTE — Progress Notes (Signed)
  HPI with pertinent ROS:   CC: Possible spider bite  HPI: Pleasant 65 year old male presenting today for evaluation of a possible spider bite on the left upper eyelid.  Notes that on Sunday, he was working outside and noticed a small bug that had landed on his face.  He did reach up and swatted away but shortly after, he noted that his left eyelid was very irritated.  Since then he has been using cool compresses as well as topical Neosporin but the redness has worsened and now it swollen to the point that he can see the swelling protruding into his visual field.  Notes that the eyelid is very sore even though he is using cold compresses.  No significant visual changes however he does feel that the left eye might be a little blurrier than usual.  Increased tearing of the left eye but no purulent drainage, fever, or chills.  I reviewed the past medical history, family history, social history, surgical history, and allergies today and no changes were needed.  Please see the problem list section below in epic for further details.   Physical exam:   General: Well Developed, well nourished, and in no acute distress.  Neuro: Alert and oriented x3, extra-ocular muscles intact, sensation grossly intact.  HEENT: Normocephalic, atraumatic, pupils equal round reactive to light.  Skin: Warm and dry.  Left upper eyelid erythema and edema noted with centralized nodular protrusion, no fluctuance or drainage noted.  No visible bite mark or bruising. Cardiac: Regular rate and rhythm, no murmurs rubs or gallops, no lower extremity edema.  Respiratory: Clear to auscultation bilaterally. Not using accessory muscles, speaking in full sentences.  Impression and Recommendations:    1. Cellulitis of left eyelid Unclear etiology however it is quite possible that he was bitten by a spider or some other insect.  We will treat this with Bactrim twice daily x7 days.  Also sending erythromycin ointment 3 times daily to the  left eyelid.  Keep the area clean and dry.  Wash with baby shampoo twice daily.  Recommend using warm compresses.  Monitor for worsening symptoms and return promptly should this occur.  Return if symptoms worsen or fail to improve. ___________________________________________ Clearnce Sorrel, DNP, APRN, FNP-BC [facility] and [place]

## 2021-08-06 NOTE — Progress Notes (Signed)
  HPI with pertinent ROS:   CC: Insect bite  HPI:   I reviewed the past medical history, family history, social history, surgical history, and allergies today and no changes were needed.  Please see the problem list section below in epic for further details.   Physical exam:   General: Well Developed, well nourished, and in no acute distress.  Neuro: Alert and oriented x3, extra-ocular muscles intact, sensation grossly intact.  HEENT: Normocephalic, atraumatic, pupils equal round reactive to light, neck supple, no masses, no lymphadenopathy, thyroid nonpalpable.  Skin: Warm and dry, no rashes. Cardiac: Regular rate and rhythm, no murmurs rubs or gallops, no lower extremity edema.  Respiratory: Clear to auscultation bilaterally. Not using accessory muscles, speaking in full sentences.  Impression and Recommendations:    There are no diagnoses linked to this encounter.  No follow-ups on file. ___________________________________________ Clearnce Sorrel, DNP, APRN, FNP-BC [facility] and [place]

## 2021-08-27 ENCOUNTER — Ambulatory Visit (INDEPENDENT_AMBULATORY_CARE_PROVIDER_SITE_OTHER): Payer: Medicare HMO

## 2021-08-27 ENCOUNTER — Other Ambulatory Visit: Payer: Self-pay

## 2021-08-27 DIAGNOSIS — E785 Hyperlipidemia, unspecified: Secondary | ICD-10-CM

## 2021-08-27 DIAGNOSIS — E1149 Type 2 diabetes mellitus with other diabetic neurological complication: Secondary | ICD-10-CM

## 2021-08-27 DIAGNOSIS — F419 Anxiety disorder, unspecified: Secondary | ICD-10-CM

## 2021-08-27 NOTE — Progress Notes (Signed)
Chronic Care Management Pharmacy Note  08/27/2021 Name:  Danny Morgan. Danny Morgan. MRN:  [id number] DOB:  April 25, 1956  Summary: addressed mental health, HLD, and primarily DM. Received forms that patient dropped off for Ozempic patient assistance.   Recommendations/Changes made from today's visit:  Processed ozempic forms for patient, contacted patient & left voicemail of this update. May provide sample ozempic pen in office if patient ran out.   Plan: f/u with pharmacist in 1 month, careguide will reach out to patient for good appointment time for phone call.   Subjective: Danny Morgan. Danny Morgan. is an 65 y.o. year old male who is a primary patient of Samuel Bouche, NP.  The CCM team was consulted for assistance with disease management and care coordination needs.    Attempted contact by telephone  for follow up visit, provided care coordination of ozempic patient assistance in response to provider referral for pharmacy case management and/or care coordination services.   Consent to Services:  The patient was given information about Chronic Care Management services, agreed to services, and gave verbal consent prior to initiation of services.  Please see initial visit note for detailed documentation.   Patient Care Team: Samuel Bouche, NP as PCP - General (Nurse Practitioner) Michael Boston, MD as Consulting Physician (General Surgery) Darius Bump, [facility] (Pharmacist)   Objective:  Lab Results  Component Value Date   CREATININE 1.02 06/17/2020    Lab Results  Component Value Date   HGBA1C 6.6 (A) 03/17/2021   Last diabetic Eye exam:  Lab Results  Component Value Date/Time   HMDIABEYEEXA No Retinopathy 10/02/2020 12:00 AM    Last diabetic Foot exam: No results found for: HMDIABFOOTEX      Component Value Date/Time   CHOL 103 06/17/2020 1108   TRIG 90 06/17/2020 1108   HDL 34 (L) 06/17/2020 1108   CHOLHDL 3.0 06/17/2020 1108   [place] 52 06/17/2020 1108    Hepatic  Function Latest Ref Rng & Units 10/25/2020 06/17/2020  Total Protein 6.1 - 8.1 g/dL 6.9 6.8  AST 10 - 35 U/L 18 17  ALT 9 - 46 U/L 24 25  Total Bilirubin 0.2 - 1.2 mg/dL 0.3 0.5  Bilirubin, Direct 0.0 - 0.2 mg/dL 0.1 -    No results found for: TSH, FREET4  CBC Latest Ref Rng & Units 10/25/2020 07/30/2020 06/17/2020  WBC 3.8 - 10.8 Thousand/uL 8.0 10.5 8.7  Hemoglobin 13.2 - 17.1 g/dL 15.8 15.6 16.1  Hematocrit 38.5 - 50.0 % 48.2 46.8 48.8  Platelets 140 - 400 Thousand/uL 252 277 266   Social History   Tobacco Use  Smoking Status Former   Types: Cigarettes  Smokeless Tobacco Never  Tobacco Comments   quit at age 44   BP Readings from Last 3 Encounters:  08/06/21 (!) 147/74  04/23/21 116/68  03/17/21 138/76   Pulse Readings from Last 3 Encounters:  08/06/21 74  04/23/21 74  03/17/21 72   Wt Readings from Last 3 Encounters:  08/06/21 253 lb (114.8 kg)  04/23/21 254 lb (115.2 kg)  03/17/21 261 lb (118.4 kg)    Assessment: Review of patient past medical history, allergies, medications, health status, including review of consultants reports, laboratory and other test data, was performed as part of comprehensive evaluation and provision of chronic care management services.   SDOH:  (Social Determinants of Health) assessments and interventions performed:    CCM Care Plan  Allergies  Allergen Reactions   Penicillins Shortness Of Breath and Swelling  Cephalexin Diarrhea    Medications Reviewed Today     Reviewed by Samuel Bouche, NP (Nurse Practitioner) on 08/06/21 at 1800  Med List Status: <None>   Medication Order Taking? Sig Documenting Provider Last Dose Status Informant  amitriptyline (ELAVIL) 25 MG tablet [id number] Yes TAKE 1 TABLET BY MOUTH EVERYDAY AT BEDTIME Samuel Bouche, NP Taking Active   atorvastatin (LIPITOR) 40 MG tablet [id number] Yes TAKE 1 TABLET (40 MG TOTAL) BY MOUTH DAILY. NEEDS LABS FOR FURTHER REFILLS Samuel Bouche, NP Taking Active   Biotin 5000 MCG  TABS [id number] Yes Take 1 tablet by mouth daily. [provider] Taking Active   Black Pepper-Turmeric (TURMERIC CURCUMIN) 01-999 MG CAPS [id number] Yes Take 1 tablet by mouth daily. [provider] Taking Active   cetirizine (ZYRTEC) 10 MG tablet [id number] Yes Take 10 mg by mouth daily. [provider] Taking Active   Echinacea 125 MG TABS [id number] Yes Take 1 tablet by mouth daily. [provider] Taking Active   erythromycin ophthalmic ointment [id number] Yes Place 1 application into the left eye 3 (three) times daily for 5 days. Apply a pea sized amount to the left upper eyelid TID for 5 days. Samuel Bouche, NP  Active   ipratropium (ATROVENT) 0.06 % nasal spray [id number] Yes Place 2 sprays into both nostrils 2 (two) times daily. [provider] Taking Active   LORazepam (ATIVAN) 1 MG tablet [id number] Yes Take 1 tablet (1 mg total) by mouth daily as needed for anxiety. Samuel Bouche, NP Taking Active   meloxicam (MOBIC) 15 MG tablet [id number] Yes TAKE 1 TABLET (15 MG TOTAL) BY MOUTH DAILY. Samuel Bouche, NP Taking Active   metFORMIN (GLUCOPHAGE) 1000 MG tablet [id number] Yes TAKE 1 TABLET BY MOUTH TWICE A DAY Jessup, Joy, NP Taking Active   montelukast (SINGULAIR) 10 MG tablet [id number] Yes Take 1 tablet (10 mg total) by mouth at bedtime. Kennith Gain, MD Taking Active   Omega-3 Fatty Acids (FISH OIL) 1200 MG CAPS [id number] Yes Take 1,200 mg by mouth daily. [provider] Taking Active   omeprazole (PRILOSEC) 10 MG capsule [id number] Yes Take 10 mg by mouth daily.  [provider] Taking Active   predniSONE (DELTASONE) 50 MG tablet [id number] Yes Take 1 tablet (50 mg total) by mouth daily. Samuel Bouche, NP Taking Active   Semaglutide, 1 MG/DOSE, 4 MG/3ML SOPN [id number] Yes Inject 1 mg as directed once a week. Samuel Bouche, NP  Active   sulfamethoxazole-trimethoprim (BACTRIM DS) 800-160 MG tablet [id number] Yes Take 1 tablet by mouth 2  (two) times daily. Samuel Bouche, NP  Active   tadalafil (CIALIS) 20 MG tablet [id number] Yes Take 1 tablet (20 mg total) by mouth daily as needed for erectile dysfunction. Samuel Bouche, NP Taking Active   Vit C-Cholecalciferol-Rose Hip (VITAMIN C & D3/ROSE HIPS) [phone] MG-UNIT-MG CAPS [id number] Yes Take 1 tablet by mouth daily. [provider] Taking Active   Zinc 30 MG CAPS [id number] Yes Take 30 mg by mouth daily. [provider] Taking Active             Patient Active Problem List   Diagnosis Date Noted   Obesity (BMI 35.0-39.9 without comorbidity) 02/06/2021   Epistaxis 11/12/2020   Erectile dysfunction due to type 2 diabetes mellitus ([place]) 11/12/2020   Adjustment disorder with mixed anxiety and depressed mood 09/17/2020   Body mass index (BMI) 39.0-39.9, adult 09/17/2020   Insomnia 09/17/2020   Neuropathy of both feet 09/17/2020   Palpitations 09/17/2020  Pilar cyst 09/17/2020   Stress due to family tension 09/17/2020   Tubular adenoma of colon 09/17/2020   DOE (dyspnea on exertion) 06/24/2020   Chest discomfort 06/24/2020   Morbid obesity ([place]) 06/24/2020   Cardiac murmur 06/24/2020   Allergy    Anxiety    Cataract    Colon polyps    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(HCC)    GERD (gastroesophageal reflux disease)    Hyperlipidemia     Immunization History  Administered Date(s) Administered   PFIZER(Purple Top)SARS-COV-2 Vaccination 01/29/2020, 02/20/2020    Conditions to be addressed/monitored: HLD, DMII, and Depression  There are no care plans that you recently modified to display for this patient.   Medication Assistance:  TBD  Patient's preferred pharmacy is:  [id number] [place], [place] [address] Phone: [phone] Fax: [phone]  [facility] [id number] - [place], [place]  [address] Phone: [phone] Fax: [facility] [address], [place] [address] [place] [address] Phone: [phone] Fax: [phone]  Uses pill box? Yes Pt endorses 100% compliance  Follow Up:  Patient agrees to Care Plan and Follow-up.  Plan: Telephone follow up appointment with care management team member scheduled for:  1 month  Larinda Buttery, PharmD Clinical Pharmacist [facility] [phone]

## 2021-08-27 NOTE — Patient Instructions (Signed)
Visit Information  Thank you for taking time to visit with me today. Please don't hesitate to contact me if I can be of assistance to you before our next scheduled telephone appointment.  Following are the goals we discussed today:   Patient Goals/Self-Care Activities Over the next 30 days, patient will:  take medications as prescribed and collaborate with provider on medication access solutions  Follow Up Plan: Telephone appointment with care management team member scheduled for: 1 month  Please call the care guide team at [phone] if you need to cancel or reschedule your appointment.   If you are experiencing a Mental Health or [place] or need someone to talk to, please call the Suicide and Crisis Lifeline: 988 call 1-800-273-TALK (toll free, 24 hour hotline)   Patient verbalizes understanding of instructions provided today and agrees to view in [place].   Darius Bump

## 2021-08-28 ENCOUNTER — Ambulatory Visit (INDEPENDENT_AMBULATORY_CARE_PROVIDER_SITE_OTHER): Payer: Medicare HMO | Admitting: Sports Medicine

## 2021-08-28 ENCOUNTER — Ambulatory Visit (INDEPENDENT_AMBULATORY_CARE_PROVIDER_SITE_OTHER): Payer: Medicare HMO

## 2021-08-28 DIAGNOSIS — M48061 Spinal stenosis, lumbar region without neurogenic claudication: Secondary | ICD-10-CM

## 2021-08-28 DIAGNOSIS — M19012 Primary osteoarthritis, left shoulder: Secondary | ICD-10-CM | POA: Diagnosis not present

## 2021-08-28 DIAGNOSIS — M7542 Impingement syndrome of left shoulder: Secondary | ICD-10-CM

## 2021-08-28 DIAGNOSIS — E785 Hyperlipidemia, unspecified: Secondary | ICD-10-CM | POA: Diagnosis not present

## 2021-08-28 DIAGNOSIS — R69 Illness, unspecified: Secondary | ICD-10-CM | POA: Diagnosis not present

## 2021-08-28 DIAGNOSIS — M545 Low back pain, unspecified: Secondary | ICD-10-CM | POA: Diagnosis not present

## 2021-08-28 DIAGNOSIS — F4323 Adjustment disorder with mixed anxiety and depressed mood: Secondary | ICD-10-CM

## 2021-08-28 HISTORY — DX: Spinal stenosis, lumbar region without neurogenic claudication: M48.061

## 2021-08-28 HISTORY — DX: Impingement syndrome of left shoulder: M75.42

## 2021-08-28 LAB — LIPID PANEL
Cholesterol: 93 mg/dL (ref <200)
HDL: 42 mg/dL (ref >=40)
LDL Cholesterol (Calc): 38 mg/dL (calc)
Non-HDL Cholesterol (Calc): 51 mg/dL (calc) (ref <130)
Total CHOL/HDL Ratio: 2.2 (calc) (ref <5.0)
Triglycerides: 44 mg/dL (ref <150)

## 2021-08-28 MED ORDER — CELECOXIB 200 MG PO CAPS: ORAL_CAPSULE | ORAL | 2 refills | Status: DC

## 2021-08-28 NOTE — Assessment & Plan Note (Signed)
There is most certainly adjustment disorder with some anxiety and depressed mood going on as well, this will also make it difficult to fully control his musculoskeletal pain complaints. His PCP did suggest Elavil, I think this is a great idea for use at night, it will help his pain, likely help his mood, as well as likely help dry up his chronic perennial rhinitis.

## 2021-08-28 NOTE — Progress Notes (Signed)
° ° °  Procedures performed today:    None.  Independent interpretation of notes and tests performed by another provider:   None.  Brief History, Exam, Impression, and Recommendations:    Lumbar spinal stenosis Pleasant 65 year old male, has had several months of pain in his low back, left-sided with radiation down to the left buttock and thigh but not past the knee, better with flexion, worse laying in bed at night. Suspect lumbar spinal stenosis versus facet arthritis, he has had greater than 12 weeks of chiropractic manipulation. Adding home physical therapy, updated x-rays, discontinue meloxicam and we will switch to Celebrex. Return in 6 weeks, if not sufficiently better we will proceed with MRI for interventional planning.  Impingement syndrome, shoulder, left Pain over the deltoid, worse with overhead activities, impingement signs on exam. Adding updated x-rays, Celebrex as below. Home conditioning given, return to see me in 6 weeks, subacromial injection if no better.  Adjustment disorder with mixed anxiety and depressed mood There is most certainly adjustment disorder with some anxiety and depressed mood going on as well, this will also make it difficult to fully control his musculoskeletal pain complaints. His PCP did suggest Elavil, I think this is a great idea for use at night, it will help his pain, likely help his mood, as well as likely help dry up his chronic perennial rhinitis.    ___________________________________________ Gwen Her. Dianah Field, M.D., ABFM., CAQSM. Primary Care and [place] Instructor of [place] of [facility]

## 2021-08-28 NOTE — Assessment & Plan Note (Signed)
Pain over the deltoid, worse with overhead activities, impingement signs on exam. Adding updated x-rays, Celebrex as below. Home conditioning given, return to see me in 6 weeks, subacromial injection if no better.

## 2021-08-28 NOTE — Assessment & Plan Note (Signed)
Pleasant 65 year old male, has had several months of pain in his low back, left-sided with radiation down to the left buttock and thigh but not past the knee, better with flexion, worse laying in bed at night. Suspect lumbar spinal stenosis versus facet arthritis, he has had greater than 12 weeks of chiropractic manipulation. Adding home physical therapy, updated x-rays, discontinue meloxicam and we will switch to Celebrex. Return in 6 weeks, if not sufficiently better we will proceed with MRI for interventional planning.

## 2021-08-29 ENCOUNTER — Other Ambulatory Visit: Payer: Self-pay | Admitting: Medical-Surgical

## 2021-08-29 MED ORDER — ATORVASTATIN CALCIUM 20 MG PO TABS: 20.0000 mg | ORAL_TABLET | Freq: Every day | ORAL | 1 refills | Status: DC

## 2021-09-03 ENCOUNTER — Other Ambulatory Visit: Payer: Self-pay

## 2021-09-03 ENCOUNTER — Ambulatory Visit: Payer: Medicare HMO | Admitting: Pharmacist

## 2021-09-03 DIAGNOSIS — F419 Anxiety disorder, unspecified: Secondary | ICD-10-CM

## 2021-09-03 DIAGNOSIS — E1149 Type 2 diabetes mellitus with other diabetic neurological complication: Secondary | ICD-10-CM

## 2021-09-03 DIAGNOSIS — E785 Hyperlipidemia, unspecified: Secondary | ICD-10-CM

## 2021-09-03 NOTE — Progress Notes (Signed)
Chronic Care Management Pharmacy Note  09/03/2021 Name:  Danny Morgan. Danny Morgan. MRN:  [id number] DOB:  11/26/1955  Summary: addressed mental health, HLD, and primarily DM. Patient states he restarted ozempic, on 08/31/21 at a dose of 1mg  weekly. He is tolerating without side effects or concerns. Patient assistance was submitted on 08/27/21 and we are still awaiting update from the company.   Recommendations/Changes made from today's visit:  Continue current regimen, counseled on importance of starting amitriptyline, provided medication review and answered patient questions.  Patient is due for urine microalbumin recheck at next PCP visit.  Plan: f/u with pharmacist in 1 month  Subjective: Danny Morgan. Danny Morgan. is an 65 y.o. year old male who is a primary patient of Samuel Bouche, NP.  The CCM team was consulted for assistance with disease management and care coordination needs.    Engaged with patient by telephone for follow up visit in response to provider referral for pharmacy case management and/or care coordination services.   Consent to Services:  The patient was given information about Chronic Care Management services, agreed to services, and gave verbal consent prior to initiation of services.  Please see initial visit note for detailed documentation.   Patient Care Team: Samuel Bouche, NP as PCP - General (Nurse Practitioner) Michael Boston, MD as Consulting Physician (General Surgery) Darius Bump, [facility] (Pharmacist)   Objective:  Lab Results  Component Value Date   CREATININE 1.02 06/17/2020    Lab Results  Component Value Date   HGBA1C 6.6 (A) 03/17/2021   Last diabetic Eye exam:  Lab Results  Component Value Date/Time   HMDIABEYEEXA No Retinopathy 10/02/2020 12:00 AM    Last diabetic Foot exam: No results found for: HMDIABFOOTEX      Component Value Date/Time   CHOL 93 08/28/2021 0928   TRIG 44 08/28/2021 0928   HDL 42 08/28/2021 0928   CHOLHDL 2.2  08/28/2021 0928   LDLCALC 38 08/28/2021 0928    Hepatic Function Latest Ref Rng & Units 10/25/2020 06/17/2020  Total Protein 6.1 - 8.1 g/dL 6.9 6.8  AST 10 - 35 U/L 18 17  ALT 9 - 46 U/L 24 25  Total Bilirubin 0.2 - 1.2 mg/dL 0.3 0.5  Bilirubin, Direct 0.0 - 0.2 mg/dL 0.1 -    No results found for: TSH, FREET4  CBC Latest Ref Rng & Units 10/25/2020 07/30/2020 06/17/2020  WBC 3.8 - 10.8 Thousand/uL 8.0 10.5 8.7  Hemoglobin 13.2 - 17.1 g/dL 15.8 15.6 16.1  Hematocrit 38.5 - 50.0 % 48.2 46.8 48.8  Platelets 140 - 400 Thousand/uL 252 277 266   Social History   Tobacco Use  Smoking Status Former   Types: Cigarettes  Smokeless Tobacco Never  Tobacco Comments   quit at age 65   BP Readings from Last 3 Encounters:  08/06/21 (!) 147/74  04/23/21 116/68  03/17/21 138/76   Pulse Readings from Last 3 Encounters:  08/06/21 74  04/23/21 74  03/17/21 72   Wt Readings from Last 3 Encounters:  08/06/21 253 lb (114.8 kg)  04/23/21 254 lb (115.2 kg)  03/17/21 261 lb (118.4 kg)    Assessment: Review of patient past medical history, allergies, medications, health status, including review of consultants reports, laboratory and other test data, was performed as part of comprehensive evaluation and provision of chronic care management services.   SDOH:  (Social Determinants of Health) assessments and interventions performed:    CCM Care Plan  Allergies  Allergen Reactions  Penicillins Shortness Of Breath and Swelling   Cephalexin Diarrhea    Medications Reviewed Today     Reviewed by Darius Bump, [facility] (Pharmacist) on 09/03/21 at 1423  Med List Status: <None>   Medication Order Taking? Sig Documenting Provider Last Dose Status Informant  amitriptyline (ELAVIL) 25 MG tablet [id number] Yes TAKE 1 TABLET BY MOUTH EVERYDAY AT BEDTIME Samuel Bouche, NP Taking Active   atorvastatin (LIPITOR) 20 MG tablet [id number] Yes Take 1 tablet (20 mg total) by mouth daily. NEEDS LABS FOR FURTHER  REFILLS Samuel Bouche, NP Taking Active   Biotin 5000 MCG TABS [id number] Yes Take 1 tablet by mouth daily. [provider] Taking Active   Black Pepper-Turmeric (TURMERIC CURCUMIN) 01-999 MG CAPS [id number] Yes Take 1 tablet by mouth daily. [provider] Taking Active   celecoxib (CELEBREX) 200 MG capsule [id number] Yes One to 2 tablets by mouth daily as needed for pain. Silverio Decamp, MD Taking Active   cetirizine (ZYRTEC) 10 MG tablet [id number] Yes Take 10 mg by mouth daily. [provider] Taking Active   Echinacea 125 MG TABS [id number] Yes Take 1 tablet by mouth daily. [provider] Taking Active   ipratropium (ATROVENT) 0.06 % nasal spray [id number] Yes Place 2 sprays into both nostrils 2 (two) times daily. [provider] Taking Active   LORazepam (ATIVAN) 1 MG tablet [id number] Yes Take 1 tablet (1 mg total) by mouth daily as needed for anxiety. Samuel Bouche, NP Taking Active   metFORMIN (GLUCOPHAGE) 1000 MG tablet [id number] Yes TAKE 1 TABLET BY MOUTH TWICE A DAY Jessup, Joy, NP Taking Active   montelukast (SINGULAIR) 10 MG tablet [id number] Yes Take 1 tablet (10 mg total) by mouth at bedtime. Kennith Gain, MD Taking Active   Omega-3 Fatty Acids (FISH OIL) 1200 MG CAPS [id number] Yes Take 1,200 mg by mouth daily. [provider] Taking Active   omeprazole (PRILOSEC) 10 MG capsule [id number] Yes Take 10 mg by mouth daily.  [provider] Taking Active   Semaglutide, 1 MG/DOSE, 4 MG/3ML SOPN [id number] Yes Inject 1 mg as directed once a week. Samuel Bouche, NP Taking Active   tadalafil (CIALIS) 20 MG tablet [id number] Yes Take 1 tablet (20 mg total) by mouth daily as needed for erectile dysfunction. Samuel Bouche, NP Taking Active   Vit C-Cholecalciferol-Rose Hip (VITAMIN C & D3/ROSE HIPS) [phone] MG-UNIT-MG CAPS [id number] Yes Take 1 tablet by mouth daily. [provider] Taking Active   Zinc 30 MG CAPS  [id number] Yes Take 30 mg by mouth daily. [provider] Taking Active             Patient Active Problem List   Diagnosis Date Noted   Lumbar spinal stenosis 08/28/2021   Impingement syndrome, shoulder, left 08/28/2021   Obesity (BMI 35.0-39.9 without comorbidity) 02/06/2021   Epistaxis 11/12/2020   Erectile dysfunction due to type 2 diabetes mellitus ([place]) 11/12/2020   Adjustment disorder with mixed anxiety and depressed mood 09/17/2020   Body mass index (BMI) 39.0-39.9, adult 09/17/2020   Insomnia 09/17/2020   Neuropathy of both feet 09/17/2020   Palpitations 09/17/2020   Pilar cyst 09/17/2020   Stress due to family tension 09/17/2020   Tubular adenoma of colon 09/17/2020   DOE (dyspnea on exertion) 06/24/2020   Chest discomfort 06/24/2020   Morbid obesity ([place]) 06/24/2020   Cardiac murmur 06/24/2020   Allergy    Anxiety    Cataract    Colon polyps    Type  2 diabetes mellitus with neurological complications ([place])    GERD (gastroesophageal reflux disease)    Hyperlipidemia     Immunization History  Administered Date(s) Administered   PFIZER(Purple Top)SARS-COV-2 Vaccination 01/29/2020, 02/20/2020    Conditions to be addressed/monitored: HLD, DMII, and Depression  Care Plan : Medication Management  Updates made by Darius Bump, [place] since 09/03/2021 12:00 AM     Problem: HLD, DM, mental health      Long-Range Goal: Disease Progression Prevention   Start Date: 05/01/2021  Recent Progress: On track  Priority: High  Note:   Current Barriers:  Unable to independently afford treatment regimen  Pharmacist Clinical Goal(s):  Over the next 30 days, patient will verbalize ability to afford treatment regimen through collaboration with PharmD and provider.   Interventions: 1:1 collaboration with Samuel Bouche, NP regarding development and update of comprehensive plan of care as evidenced by provider attestation and co-signature Inter-disciplinary care  team collaboration (see longitudinal plan of care) Comprehensive medication review performed; medication list updated in electronic medical record  Diabetes:  Controlled; current treatment: metformin 1000mg  BID, ozempic 1mg  weekly; a1c 6.6  Current glucose readings: not currently checking  Denies hypoglycemic/hyperglycemic symptoms  Current meal patterns: to be discussed at future visits  Current exercise: renovating a house, still working  Recommended continue current regimen. Awaiting response from company for ozempic PAP. Hyperlipidemia:  Controlled; current treatment:atorvastatin 20mg  daily, reduced by PCP d/t LDL 38  Recommended continue current regimen  Mental Health  Uncontrolled; current treatment: ativan PRN anxiety, amitryptyline 25mg  for sleep  Recommended patient get connected with couples and/or individual therapy, as well as consider maintenance medication such as SSRI.   Patient Goals/Self-Care Activities Over the next 30 days, patient will:  take medications as prescribed and collaborate with provider on medication access solutions  Follow Up Plan: Telephone appointment with care management team member scheduled for:1 month     Medication Assistance:  TBD  Patient's preferred pharmacy [facility]:  [id number] [place], [place] [address] Phone: [phone] Fax: [place] [id number] - [place], [place] [address] Phone: [phone] Fax: [phone]  [facility] [address], [place] [address] [place] [address] Phone: [phone] Fax: [phone]  Uses pill box? Yes Pt endorses 100% compliance  Follow Up:  Patient agrees to Care Plan and Follow-up.  Plan: Telephone follow up appointment with care management team member scheduled for:  1 month  Larinda Buttery, PharmD Clinical  Pharmacist [facility] [phone]

## 2021-09-03 NOTE — Patient Instructions (Signed)
Visit Information  Thank you for taking time to visit with me today. Please don't hesitate to contact me if I can be of assistance to you before our next scheduled telephone appointment.  Following are the goals we discussed today:   Patient Goals/Self-Care Activities Over the next 30 days, patient will:  take medications as prescribed and collaborate with provider on medication access solutions  Follow Up Plan: Telephone appointment with care management team member scheduled for:1 month  Please call the care guide team at [phone] if you need to cancel or reschedule your appointment.   If you are experiencing a Mental Health or [place] or need someone to talk to, please call the Suicide and Crisis Lifeline: 988 call 1-800-273-TALK (toll free, 24 hour hotline)   Patient verbalizes understanding of instructions provided today and agrees to view in [place].   Darius Bump

## 2021-09-06 DIAGNOSIS — Z7984 Long term (current) use of oral hypoglycemic drugs: Secondary | ICD-10-CM

## 2021-09-06 DIAGNOSIS — E785 Hyperlipidemia, unspecified: Secondary | ICD-10-CM | POA: Diagnosis not present

## 2021-09-06 DIAGNOSIS — E1169 Type 2 diabetes mellitus with other specified complication: Secondary | ICD-10-CM

## 2021-09-15 ENCOUNTER — Telehealth: Payer: Self-pay | Admitting: *Deleted

## 2021-09-15 NOTE — Chronic Care Management (AMB) (Signed)
°  Care Management   Note  09/15/2021 Name: Danny Morgan. [name] MRN: [id number] DOB: 1955/10/26  [name]. [name]. is a 66 y.o. year old male who is a primary care patient of Samuel Bouche, NP and is actively engaged with the care management team. I reached out to [name] by phone today to assist with re-scheduling a follow up visit with the Pharmacist  Follow up plan: Unsuccessful telephone outreach attempt made. A HIPAA compliant phone message was left for the patient providing contact information and requesting a return call.   Julian Hy, [place] Management  Direct Dial: [phone]

## 2021-09-15 NOTE — Chronic Care Management (AMB) (Signed)
°  Care Management   Note  09/15/2021 Name: Amram Maya. [name] MRN: [id number] DOB: 12-05-1955  [name]. [name]. is a 66 y.o. year old male who is a primary care patient of Samuel Bouche, NP and is actively engaged with the care management team. I reached out to [place] D. [name] by phone today to assist with re-scheduling a follow up visit with the Pharmacist  Follow up plan: Telephone appointment with care management team member scheduled for: 10/01/2021  Julian Hy, [place], [place] Management  Direct Dial: [phone]

## 2021-09-17 ENCOUNTER — Ambulatory Visit (INDEPENDENT_AMBULATORY_CARE_PROVIDER_SITE_OTHER): Payer: Medicare HMO | Admitting: Medical-Surgical

## 2021-09-17 ENCOUNTER — Encounter: Payer: Self-pay | Admitting: Medical-Surgical

## 2021-09-17 ENCOUNTER — Other Ambulatory Visit: Payer: Self-pay

## 2021-09-17 VITALS — BP 121/68 | HR 74 | Resp 20 | Ht 71.0 in | Wt 248.0 lb

## 2021-09-17 DIAGNOSIS — N521 Erectile dysfunction due to diseases classified elsewhere: Secondary | ICD-10-CM | POA: Diagnosis not present

## 2021-09-17 DIAGNOSIS — F4323 Adjustment disorder with mixed anxiety and depressed mood: Secondary | ICD-10-CM

## 2021-09-17 DIAGNOSIS — F5104 Psychophysiologic insomnia: Secondary | ICD-10-CM

## 2021-09-17 DIAGNOSIS — F419 Anxiety disorder, unspecified: Secondary | ICD-10-CM | POA: Diagnosis not present

## 2021-09-17 DIAGNOSIS — E1169 Type 2 diabetes mellitus with other specified complication: Secondary | ICD-10-CM

## 2021-09-17 DIAGNOSIS — Z6839 Body mass index (BMI) 39.0-39.9, adult: Secondary | ICD-10-CM | POA: Diagnosis not present

## 2021-09-17 DIAGNOSIS — Z0000 Encounter for general adult medical examination without abnormal findings: Secondary | ICD-10-CM | POA: Diagnosis not present

## 2021-09-17 DIAGNOSIS — E782 Mixed hyperlipidemia: Secondary | ICD-10-CM | POA: Diagnosis not present

## 2021-09-17 DIAGNOSIS — E1149 Type 2 diabetes mellitus with other diabetic neurological complication: Secondary | ICD-10-CM | POA: Diagnosis not present

## 2021-09-17 DIAGNOSIS — R69 Illness, unspecified: Secondary | ICD-10-CM | POA: Diagnosis not present

## 2021-09-17 LAB — CBC WITH DIFFERENTIAL/PLATELET
Absolute Monocytes: 570 cells/uL (ref 200–950)
Basophils Absolute: 81 cells/uL (ref 0–200)
Basophils Relative: 1.1 %
Eosinophils Absolute: 570 cells/uL — ABNORMAL HIGH (ref 15–500)
Eosinophils Relative: 7.7 %
HCT: 50.4 % — ABNORMAL HIGH (ref 38.5–50.0)
Hemoglobin: 16.3 g/dL (ref 13.2–17.1)
Lymphs Abs: 2079 cells/uL (ref 850–3900)
MCH: 26.5 pg — ABNORMAL LOW (ref 27.0–33.0)
MCHC: 32.3 g/dL (ref 32.0–36.0)
MCV: 81.8 fL (ref 80.0–100.0)
MPV: 11.3 fL (ref 7.5–12.5)
Monocytes Relative: 7.7 %
Neutro Abs: 4100 cells/uL (ref 1500–7800)
Neutrophils Relative %: 55.4 %
Platelets: 242 10*3/uL (ref 140–400)
RBC: 6.16 10*6/uL — ABNORMAL HIGH (ref 4.20–5.80)
RDW: 13.3 % (ref 11.0–15.0)
Total Lymphocyte: 28.1 %
WBC: 7.4 10*3/uL (ref 3.8–10.8)

## 2021-09-17 LAB — POCT UA - MICROALBUMIN
Albumin/Creatinine Ratio, Urine, POC: <30
Creatinine, POC: 300 mg/dL
Microalbumin Ur, POC: 10 mg/L

## 2021-09-17 LAB — COMPLETE METABOLIC PANEL WITH GFR
AG Ratio: 2 (calc) (ref 1.0–2.5)
ALT: 18 U/L (ref 9–46)
AST: 17 U/L (ref 10–35)
Albumin: 4.1 g/dL (ref 3.6–5.1)
Alkaline phosphatase (APISO): 83 U/L (ref 35–144)
BUN: 18 mg/dL (ref 7–25)
CO2: 25 mmol/L (ref 20–32)
Calcium: 9.2 mg/dL (ref 8.6–10.3)
Chloride: 103 mmol/L (ref 98–110)
Creat: 1.01 mg/dL (ref 0.70–1.35)
Globulin: 2.1 g/dL (calc) (ref 1.9–3.7)
Glucose, Bld: 183 mg/dL — ABNORMAL HIGH (ref 65–99)
Potassium: 4.2 mmol/L (ref 3.5–5.3)
Sodium: 137 mmol/L (ref 135–146)
Total Bilirubin: 0.4 mg/dL (ref 0.2–1.2)
Total Protein: 6.2 g/dL (ref 6.1–8.1)
eGFR: 82 mL/min/{1.73_m2} (ref >=60)

## 2021-09-17 LAB — POCT GLYCOSYLATED HEMOGLOBIN (HGB A1C): HbA1c, POC (controlled diabetic range): 7.5 % — AB (ref 0.0–7.0)

## 2021-09-17 MED ORDER — LORAZEPAM 1 MG PO TABS: 1.0000 mg | ORAL_TABLET | Freq: Every day | ORAL | 0 refills | Status: DC | PRN

## 2021-09-17 NOTE — Progress Notes (Signed)
Established patient office visit  HPI with pertinent ROS:   CC: DM follow up  HPI: Pleasant 66 year old male presenting today for follow-up on diabetes.  Has been working with our clinical pharmacist on his diabetic medication adherence and regimen.  He has stopped taking glipizide as he was apprehensive about possible hypoglycemia.  He has been using Ozempic as prescribed however he was off of it for 2 months.  Has noticed a little upset stomach, specifically belching since restarting Ozempic but thinks this may be related to taking a large dose after being off of it for so long.  Does check her sugars at home with most readings under 140 fasting.  Mood-he is very very stressed.  Working greater than 100 hours some weeks.  Has very poor sleep and only gets about 5 hours per night.  He did start the amitriptyline finally after being encouraged by Dr. Darene Lamer.  He has only been on it 1 week and has not noticed much of a difference yet.  Notes that he does have lorazepam at home to use and he only uses this about once or twice a week to help calm his mind so he can get some decent sleep.  Would like a refill on this today.  Denies SI/HI.  I reviewed the past medical history, family history, social history, surgical history, and allergies today and no changes were needed.  Please see the problem list section below in epic for further details.   Brief exam, Assessment, and Plan:   Today's Vitals: BP 121/68 (BP Location: Left Arm, Patient Position: Sitting, Cuff Size: Normal)    Pulse 74    Resp 20    Ht 5\' 11"  (1.803 m)    Wt 248 lb (112.5 kg)    SpO2 98%    BMI 34.59 kg/m   1. 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(HCC) Hemoglobin A1c 7.5% today up from previous.  This is likely due to his stopping glipizide and being off of Ozempic for a couple of months.  Recommend continuing Ozempic 1 mg weekly as prescribed.  Continue metformin.  Be very careful when having treats and try not to  do this too often.  Continue checking blood sugars, preferable goal less than 130.  2. Adjustment disorder with mixed anxiety and depressed mood 3. Anxiety Unfortunately, a lot of his stress is situational.  His mood is normal today, speech patterns normal, normal thought patterns.  Alert and oriented, interactive.  Continue amitriptyline 25 mg nightly as prescribed.  Since sleep is a huge issue and is worsening his stress, I will agree to send in lorazepam 1 mg nightly as needed but would recommend not using this more than twice weekly.  4. Body mass index (BMI) 39.0-39.9, adult Recommend continuing Ozempic as this will help with weight management.  Would benefit from regular intentional exercise outside of work as well as limiting portions and eating a sensible, heart healthy, diabetic friendly diet.  5. Psychophysiological insomnia Sending in lorazepam as above.  6. Mixed hyperlipidemia Lipid panel recently checked and looks good.  Continue atorvastatin 20 mg daily.  7. Erectile dysfunction due to type 2 diabetes mellitus ([place]) Unfortunately Cialis has not helped and he has already been to urology for further evaluation.  The injections done at urology were not excessively helpful either and he ended up having to use a ring to maintain any erection.  Does have plans to follow-up on this as he feels he is too young  to not enjoy a healthy sexual relationship.  Return in about 3 months (around 12/16/2021) for DM/HLD/mood follow up. ___________________________________________ Clearnce Sorrel, DNP, APRN, FNP-BC [facility] [place]

## 2021-09-26 ENCOUNTER — Telehealth: Payer: Medicare HMO

## 2021-09-30 ENCOUNTER — Other Ambulatory Visit: Payer: Self-pay | Admitting: Allergy

## 2021-10-01 ENCOUNTER — Ambulatory Visit (INDEPENDENT_AMBULATORY_CARE_PROVIDER_SITE_OTHER): Payer: Medicare HMO | Admitting: Pharmacist

## 2021-10-01 ENCOUNTER — Other Ambulatory Visit: Payer: Self-pay

## 2021-10-01 DIAGNOSIS — E1149 Type 2 diabetes mellitus with other diabetic neurological complication: Secondary | ICD-10-CM

## 2021-10-01 DIAGNOSIS — F4323 Adjustment disorder with mixed anxiety and depressed mood: Secondary | ICD-10-CM

## 2021-10-01 DIAGNOSIS — E782 Mixed hyperlipidemia: Secondary | ICD-10-CM

## 2021-10-01 NOTE — Progress Notes (Signed)
Chronic Care Management Pharmacy Note  10/02/2021 Name:  Danny Morgan. Danny Morgan. MRN:  [id number] DOB:  1956-03-05  Summary: addressed mental health, HLD, and primarily DM. Patient is taking Ozempic 0.5mg  weekly using a sample from our office, self reduced dosage from 1mg , due to upset stomach.  He is tolerating without side effects or concerns. Patient assistance was submitted on 08/27/21 and we are still awaiting update from the company.  Recommendations/Changes made from today's visit:  Continue current regimen, will send request to healthcare concierge for assistance with hearing an update from ozempic patient assistance.  Plan: f/u with pharmacist in 1 month  Subjective: Danny Morgan. Danny Morgan. is an 66 y.o. year old male who is a primary patient of Samuel Bouche, NP.  The CCM team was consulted for assistance with disease management and care coordination needs.    Engaged with patient by telephone for follow up visit in response to provider referral for pharmacy case management and/or care coordination services.   Consent to Services:  The patient was given information about Chronic Care Management services, agreed to services, and gave verbal consent prior to initiation of services.  Please see initial visit note for detailed documentation.   Patient Care Team: Samuel Bouche, NP as PCP - General (Nurse Practitioner) Michael Boston, MD as Consulting Physician (General Surgery) Darius Bump, [facility] (Pharmacist)   Objective:  Lab Results  Component Value Date   CREATININE 1.01 09/17/2021   CREATININE 1.02 06/17/2020    Lab Results  Component Value Date   HGBA1C 7.5 (A) 09/17/2021   Last diabetic Eye exam:  Lab Results  Component Value Date/Time   HMDIABEYEEXA No Retinopathy 10/02/2020 12:00 AM    Last diabetic Foot exam: No results found for: HMDIABFOOTEX      Component Value Date/Time   CHOL 93 08/28/2021 0928   TRIG 44 08/28/2021 0928   HDL 42 08/28/2021 0928    CHOLHDL 2.2 08/28/2021 0928   LDLCALC 38 08/28/2021 0928    Hepatic Function Latest Ref Rng & Units 09/17/2021 10/25/2020 06/17/2020  Total Protein 6.1 - 8.1 g/dL 6.2 6.9 6.8  AST 10 - 35 U/L 17 18 17   ALT 9 - 46 U/L 18 24 25   Total Bilirubin 0.2 - 1.2 mg/dL 0.4 0.3 0.5  Bilirubin, Direct 0.0 - 0.2 mg/dL - 0.1 -    No results found for: TSH, FREET4  CBC Latest Ref Rng & Units 09/17/2021 10/25/2020 07/30/2020  WBC 3.8 - 10.8 Thousand/uL 7.4 8.0 10.5  Hemoglobin 13.2 - 17.1 g/dL 16.3 15.8 15.6  Hematocrit 38.5 - 50.0 % 50.4(H) 48.2 46.8  Platelets 140 - 400 Thousand/uL 242 252 277   Social History   Tobacco Use  Smoking Status Former   Types: Cigarettes  Smokeless Tobacco Never  Tobacco Comments   quit at age 3   BP Readings from Last 3 Encounters:  09/17/21 121/68  08/06/21 (!) 147/74  04/23/21 116/68   Pulse Readings from Last 3 Encounters:  09/17/21 74  08/06/21 74  04/23/21 74   Wt Readings from Last 3 Encounters:  09/17/21 248 lb (112.5 kg)  08/06/21 253 lb (114.8 kg)  04/23/21 254 lb (115.2 kg)    Assessment: Review of patient past medical history, allergies, medications, health status, including review of consultants reports, laboratory and other test data, was performed as part of comprehensive evaluation and provision of chronic care management services.   SDOH:  (Social Determinants of Health) assessments and interventions performed:  CCM Care Plan  Allergies  Allergen Reactions   Penicillins Shortness Of Breath and Swelling   Cephalexin Diarrhea    Medications Reviewed Today     Reviewed by Darius Bump, [facility] (Pharmacist) on 10/02/21 at [place] List Status: <None>   Medication Order Taking? Sig Documenting Provider Last Dose Status Informant  amitriptyline (ELAVIL) 25 MG tablet [id number] Yes TAKE 1 TABLET BY MOUTH EVERYDAY AT BEDTIME Samuel Bouche, NP Taking Active   atorvastatin (LIPITOR) 20 MG tablet [id number] Yes Take 1 tablet (20 mg total)  by mouth daily. NEEDS LABS FOR FURTHER REFILLS Samuel Bouche, NP Taking Active   Biotin 5000 MCG TABS [id number] Yes Take 1 tablet by mouth daily. [provider] Taking Active   Black Pepper-Turmeric (TURMERIC CURCUMIN) 01-999 MG CAPS [id number] Yes Take 1 tablet by mouth daily. [provider] Taking Active   celecoxib (CELEBREX) 200 MG capsule [id number] Yes One to 2 tablets by mouth daily as needed for pain. Silverio Decamp, MD Taking Active   cetirizine (ZYRTEC) 10 MG tablet [id number] Yes Take 10 mg by mouth daily. [provider] Taking Active   Echinacea 125 MG TABS [id number] Yes Take 1 tablet by mouth daily. [provider] Taking Active   ipratropium (ATROVENT) 0.06 % nasal spray [id number] Yes Place 2 sprays into both nostrils 2 (two) times daily. [provider] Taking Active   LORazepam (ATIVAN) 1 MG tablet [id number] Yes Take 1 tablet (1 mg total) by mouth daily as needed for anxiety. Samuel Bouche, NP Taking Active   Magnesium 200 MG TABS [id number] Yes Take 1 tablet by mouth at bedtime. [provider] Taking Active   metFORMIN (GLUCOPHAGE) 1000 MG tablet [id number] Yes TAKE 1 TABLET BY MOUTH TWICE A DAY Jessup, Joy, NP Taking Active   montelukast (SINGULAIR) 10 MG tablet [id number] Yes Take 1 tablet (10 mg total) by mouth at bedtime. Kennith Gain, MD Taking Active   Omega-3 Fatty Acids (FISH OIL) 1200 MG CAPS [id number] Yes Take 1,200 mg by mouth daily. [provider] Taking Active   omeprazole (PRILOSEC) 10 MG capsule [id number] Yes Take 10 mg by mouth daily.  [provider] Taking Active   Semaglutide, 1 MG/DOSE, 4 MG/3ML SOPN [id number] Yes Inject 1 mg as directed once a week. Samuel Bouche, NP Taking Active   tadalafil (CIALIS) 20 MG tablet [id number] Yes Take 1 tablet (20 mg total) by mouth daily as needed for erectile dysfunction. Samuel Bouche, NP Taking Active   Vit C-Cholecalciferol-Rose Hip (VITAMIN C &  D3/ROSE HIPS) [phone] MG-UNIT-MG CAPS [id number] Yes Take 1 tablet by mouth daily. [provider] Taking Active   Zinc 30 MG CAPS [id number] Yes Take 30 mg by mouth daily. [provider] Taking Active             Patient Active Problem List   Diagnosis Date Noted   Lumbar spinal stenosis 08/28/2021   Impingement syndrome, shoulder, left 08/28/2021   Epistaxis 11/12/2020   Erectile dysfunction due to type 2 diabetes mellitus ([place]) 11/12/2020   Adjustment disorder with mixed anxiety and depressed mood 09/17/2020   Body mass index (BMI) 39.0-39.9, adult 09/17/2020   Insomnia 09/17/2020   Neuropathy of both feet 09/17/2020   Palpitations 09/17/2020   Pilar cyst 09/17/2020   Stress due to family tension 09/17/2020   Tubular adenoma of colon 09/17/2020   DOE (dyspnea on exertion) 06/24/2020   Chest discomfort 06/24/2020   Morbid obesity ([place]) 06/24/2020   Cardiac  murmur 06/24/2020   Allergy    Anxiety    Cataract    Colon polyps    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(HCC)    GERD (gastroesophageal reflux disease)    Hyperlipidemia     Immunization History  Administered Date(s) Administered   PFIZER(Purple Top)SARS-COV-2 Vaccination 01/29/2020, 02/20/2020    Conditions to be addressed/monitored: HLD, DMII, and Depression  Care Plan : Medication Management  Updates made by Darius Bump, [place] since 10/02/2021 12:00 AM     Problem: HLD, DM, mental health      Long-Range Goal: Disease Progression Prevention   Start Date: 05/01/2021  Recent Progress: On track  Priority: High  Note:   Current Barriers:  Unable to independently afford treatment regimen  Pharmacist Clinical Goal(s):  Over the next 30 days, patient will verbalize ability to afford treatment regimen through collaboration with PharmD and provider.   Interventions: 1:1 collaboration with Samuel Bouche, NP regarding development and update of comprehensive plan of  care as evidenced by provider attestation and co-signature Inter-disciplinary care team collaboration (see longitudinal plan of care) Comprehensive medication review performed; medication list updated in electronic medical record  Diabetes:  Controlled; current treatment: metformin 1000mg  BID, ozempic 0.5mg  weekly; a1c 6.6  Current glucose readings: not currently checking  Denies hypoglycemic/hyperglycemic symptoms  Current meal patterns: to be discussed at future visits  Current exercise: renovating a house, still working  Recommended continue current regimen. Awaiting response from company for ozempic PAP. Hyperlipidemia:  Controlled; current treatment:atorvastatin 20mg  daily, reduced by PCP d/t LDL 38  Recommended continue current regimen  Mental Health  Uncontrolled; current treatment: ativan PRN anxiety, amitryptyline 25mg  for sleep  Recommended patient get connected with couples and/or individual therapy, as well as consider maintenance medication such as SSRI.   Patient Goals/Self-Care Activities Over the next 30 days, patient will:  take medications as prescribed and collaborate with provider on medication access solutions  Follow Up Plan: Telephone appointment with care management team member scheduled for:1 month      Medication Assistance:  TBD  Patient's preferred pharmacy is:  [id number] - [place], [place] [address] Phone: [phone] Fax: [place] [id number] - [place], [place] [address] Phone: [phone] Fax: [phone]  [facility] [address], [place] [address] [place] [address] Phone: [phone] Fax: [phone]  Uses pill box? Yes Pt endorses 100% compliance  Follow Up:  Patient agrees to Care Plan and Follow-up.  Plan: Telephone follow up appointment with care  management team member scheduled for:  1 month  Larinda Buttery, PharmD Clinical Pharmacist [facility] [phone]

## 2021-10-02 NOTE — Patient Instructions (Signed)
Visit Information  Thank you for taking time to visit with me today. Please don't hesitate to contact me if I can be of assistance to you before our next scheduled telephone appointment.  Following are the goals we discussed today:  Patient Goals/Self-Care Activities Over the next 30 days, patient will:  take medications as prescribed and collaborate with provider on medication access solutions  Follow Up Plan: Telephone appointment with care management team member scheduled for:1 month  Please call the care guide team at [phone] if you need to cancel or reschedule your appointment.   If you are experiencing a Mental Health or [place] or need someone to talk to, please call the Suicide and Crisis Lifeline: 988 call 1-800-273-TALK (toll free, 24 hour hotline)   Patient verbalizes understanding of instructions and care plan provided today and agrees to view in [place]. Active MyChart status confirmed with patient.    Darius Bump

## 2021-10-07 DIAGNOSIS — Z7984 Long term (current) use of oral hypoglycemic drugs: Secondary | ICD-10-CM | POA: Diagnosis not present

## 2021-10-07 DIAGNOSIS — F4323 Adjustment disorder with mixed anxiety and depressed mood: Secondary | ICD-10-CM | POA: Diagnosis not present

## 2021-10-07 DIAGNOSIS — E785 Hyperlipidemia, unspecified: Secondary | ICD-10-CM | POA: Diagnosis not present

## 2021-10-07 DIAGNOSIS — R69 Illness, unspecified: Secondary | ICD-10-CM | POA: Diagnosis not present

## 2021-10-07 DIAGNOSIS — E1169 Type 2 diabetes mellitus with other specified complication: Secondary | ICD-10-CM | POA: Diagnosis not present

## 2021-10-08 ENCOUNTER — Telehealth: Payer: Self-pay | Admitting: Pharmacist

## 2021-10-08 NOTE — Progress Notes (Signed)
I have contacted [employer] to check on the status on the Ozempic patient assistance application. The representative stated the application was no validated, this usually means the application was not received or hasn't been faxed to them. They recommended to refax the application.   Danny Morgan, [place]

## 2021-10-09 ENCOUNTER — Ambulatory Visit: Payer: Medicare HMO | Admitting: Sports Medicine

## 2021-10-15 ENCOUNTER — Other Ambulatory Visit: Payer: Self-pay | Admitting: Medical-Surgical

## 2021-10-24 ENCOUNTER — Telehealth: Payer: Medicare HMO

## 2021-10-24 ENCOUNTER — Telehealth: Payer: Self-pay | Admitting: *Deleted

## 2021-10-24 NOTE — Chronic Care Management (AMB) (Unsigned)
°  Care Management   Note  10/24/2021 Name: Danny Morgan. Danny Morgan. MRN: [id number] DOB: 02-08-1956  Danny Morgan. Danny Morgan. is a 66 y.o. year old male who is a primary care patient of Samuel Bouche, NP and is actively engaged with the care management team. I reached out to [name]. by phone today to assist with re-scheduling a follow up visit with the Pharmacist  Follow up plan: Unsuccessful telephone outreach attempt made. A HIPAA compliant phone message was left for the patient providing contact information and requesting a return call.   Julian Hy, [place] Management  Direct Dial: [phone]

## 2021-11-11 NOTE — Chronic Care Management (AMB) (Signed)
?  Care Management  ? ?Note ? ?11/11/2021 ?Name: Danny Morgan. Danny Morgan. MRN: [id number] DOB: 1956/07/11 ? ?Danny Morgan. Danny Morgan. is a 66 y.o. year old male who is a primary care patient of Samuel Bouche, NP and is actively engaged with the care management team. I reached out to [place] D. Danny Morgan. by phone today to assist with re-scheduling a follow up visit with the Pharmacist ? ?Follow up plan: ?2nd Unsuccessful telephone outreach attempt made. A HIPAA compliant phone message was left for the patient providing contact information and requesting a return call.  ? ?Kirsta Probert, CCMA ?Care Guide, Embedded Care Coordination ?[place] [facility]  Care Management  ?Direct Dial: [phone] ? ? ?

## 2021-11-28 NOTE — Chronic Care Management (AMB) (Signed)
?  Care Management  ? ?Note ? ?11/28/2021 ?Name: Danny Morgan. Danny Morgan. MRN: [id number] DOB: February 10, 1956 ? ?Danny Morgan. Danny Morgan. is a 66 y.o. year old male who is a primary care patient of Samuel Bouche, NP and is actively engaged with the care management team. I reached out to [name]. by phone today to assist with re-scheduling a follow up visit with the Pharmacist ? ?Follow up plan: ?Telephone appointment with care management team member scheduled for: 03/04/2022 ? ?Tytianna Greenley, CCMA ?Care Guide, Embedded Care Coordination ?[name]  Care Management  ?Direct Dial: [phone] ? ? ?

## 2021-11-29 ENCOUNTER — Other Ambulatory Visit: Payer: Self-pay | Admitting: Medical-Surgical

## 2021-12-09 ENCOUNTER — Other Ambulatory Visit: Payer: Self-pay

## 2021-12-09 MED ORDER — SEMAGLUTIDE (1 MG/DOSE) 4 MG/3ML ~~LOC~~ SOPN: 1.0000 mg | PEN_INJECTOR | SUBCUTANEOUS | 1 refills | Status: DC

## 2021-12-09 NOTE — Telephone Encounter (Signed)
Patient needs Ozempic refilled. Wants to know if he can get a sample or if he needs to bite the bullet and get it from the pharmacy. ?

## 2021-12-09 NOTE — Progress Notes (Unsigned)
Patient needs Ozempic refilled. Wants to know if he can get a sample or if he needs to bite the bullet and get it from the pharmacy. ?

## 2021-12-15 ENCOUNTER — Ambulatory Visit: Payer: Medicare HMO | Admitting: Cardiology

## 2021-12-15 VITALS — BP 134/72 | HR 78 | Ht 71.0 in | Wt 258.1 lb

## 2021-12-15 DIAGNOSIS — E782 Mixed hyperlipidemia: Secondary | ICD-10-CM

## 2021-12-15 DIAGNOSIS — E669 Obesity, unspecified: Secondary | ICD-10-CM | POA: Diagnosis not present

## 2021-12-15 DIAGNOSIS — E1149 Type 2 diabetes mellitus with other diabetic neurological complication: Secondary | ICD-10-CM

## 2021-12-15 DIAGNOSIS — E119 Type 2 diabetes mellitus without complications: Secondary | ICD-10-CM | POA: Insufficient documentation

## 2021-12-15 DIAGNOSIS — L309 Dermatitis, unspecified: Secondary | ICD-10-CM | POA: Insufficient documentation

## 2021-12-15 DIAGNOSIS — T783XXA Angioneurotic edema, initial encounter: Secondary | ICD-10-CM | POA: Insufficient documentation

## 2021-12-15 NOTE — Progress Notes (Signed)
?Cardiology Office Note:   ? ?Date:  12/15/2021  ? ?ID:  Danny Lange. [name], DOB October 09, 1955, MRN [id number] ? ?PCP:  Samuel Bouche, NP  ?Cardiologist:  Jenean Lindau, MD  ? ?Referring MD: Samuel Bouche, NP  ? ? ?ASSESSMENT:   ? ?1. Mixed hyperlipidemia   ?2. 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([place])   ?3. Obesity (BMI 35.0-39.9 without comorbidity)   ? ?PLAN:   ? ?In order of problems listed above: ? ?Primary prevention stressed with the patient.  Importance of compliance with diet medication stressed and vocalized understanding.  Patient was advised to walk at least half an hour a day 5 days a week and he promises to do so. ?Essential hypertension: Blood pressure stable and diet was emphasized.  Lifestyle modification urged. ?Mixed dyslipidemia: Lipids were reviewed and they are fine and I discussed this with the patient ?Diabetes mellitus and obesity: He has gained 10 pounds of weight since last evaluation.  I had a lengthy talk with him about lifestyle modification and he promises to do better.  He is aware that this lifestyle will be very detrimental to his health.  He promises to do better. ?Patient will be seen in follow-up appointment in 12 months or earlier if the patient has any concerns ? ? ? ?Medication Adjustments/Labs and Tests Ordered: ?Current medicines are reviewed at length with the patient today.  Concerns regarding medicines are outlined above.  ?Orders Placed This Encounter  ?Procedures  ? EKG 12-Lead  ? ?No orders of the defined types were placed in this encounter. ? ? ? ?No chief complaint on file. ?  ? ?History of Present Illness:   ? ?Danny Lange. Jaymison Luber. is a 66 y.o. male.  Patient has past medical history of essential hypertension, diabetes mellitus, dyslipidemia and obesity.  He mentions to me that he has not been compliant with diet and exercise in the past several months because he is working 13 hours a day many days a week.  This does not leave him much to take  care of his health.  He denies any chest pain orthopnea or PND.  At the time of my evaluation, the patient is alert awake oriented and in no distress. ? ?Past Medical History:  ?Diagnosis Date  ? Adjustment disorder with mixed anxiety and depressed mood 09/17/2020  ? Allergy   ? Angio-edema   ? Anxiety   ? Body mass index (BMI) 39.0-39.9, adult 09/17/2020  ? Cardiac murmur 06/24/2020  ? Cataract   ? beginning stage in rt. eye  ? Chest discomfort 06/24/2020  ? Colon polyps   ? Diabetes mellitus without complication ([place])   ? DOE (dyspnea on exertion) 06/24/2020  ? Eczema   ? Epistaxis 11/12/2020  ? Erectile dysfunction due to type 2 diabetes mellitus ([place]) 11/12/2020  ? GERD (gastroesophageal reflux disease)   ? Hyperlipidemia   ? states cholesterol normal states being given to protect kidneys  ? Impingement syndrome, shoulder, left 08/28/2021  ? Insomnia 09/17/2020  ? Lumbar spinal stenosis 08/28/2021  ? Morbid obesity ([place]) 06/24/2020  ? Neuropathy of both feet 09/17/2020  ? Palpitations 09/17/2020  ? Pilar cyst 09/17/2020  ? Stress due to family tension 09/17/2020  ? Tubular adenoma of colon 09/17/2020  ? 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([place])   ? ? ?Past Surgical History:  ?Procedure Laterality Date  ? COLONOSCOPY W/ BIOPSIES AND POLYPECTOMY    ? RETINAL DETACHMENT SURGERY    ? age  16  ? WISDOM TOOTH EXTRACTION    ? ? ?Current Medications: ?Current Meds  ?Medication Sig  ? amitriptyline (ELAVIL) 25 MG tablet TAKE 1 TABLET BY MOUTH EVERYDAY AT BEDTIME  ? atorvastatin (LIPITOR) 20 MG tablet Take 1 tablet (20 mg total) by mouth daily. NEEDS LABS FOR FURTHER REFILLS  ? Biotin 5000 MCG TABS Take 1 tablet by mouth daily.  ? Black Pepper-Turmeric (TURMERIC CURCUMIN) 01-999 MG CAPS Take 1 tablet by mouth daily.  ? celecoxib (CELEBREX) 200 MG capsule One to 2 tablets by mouth daily as needed for pain.  ? cetirizine (ZYRTEC) 10 MG tablet Take 10 mg by mouth daily.  ? Echinacea 125 MG TABS Take 1 tablet by mouth  daily.  ? ipratropium (ATROVENT) 0.06 % nasal spray Place 2 sprays into both nostrils 2 (two) times daily.  ? LORazepam (ATIVAN) 1 MG tablet Take 1 tablet (1 mg total) by mouth daily as needed for anxiety.  ? Magnesium 200 MG TABS Take 1 tablet by mouth at bedtime.  ? metFORMIN (GLUCOPHAGE) 1000 MG tablet TAKE 1 TABLET BY MOUTH TWICE A DAY  ? montelukast (SINGULAIR) 10 MG tablet Take 1 tablet (10 mg total) by mouth at bedtime.  ? omeprazole (PRILOSEC) 10 MG capsule Take 10 mg by mouth daily.   ? Semaglutide, 1 MG/DOSE, 4 MG/3ML SOPN Inject 1 mg as directed once a week.  ? tadalafil (CIALIS) 20 MG tablet Take 1 tablet (20 mg total) by mouth daily as needed for erectile dysfunction.  ? UNABLE TO FIND Take 1 tablet by mouth daily. Med Name: Omega 369  ? Vit C-Cholecalciferol-Rose Hip (VITAMIN C & D3/ROSE HIPS) [phone] MG-UNIT-MG CAPS Take 1 tablet by mouth daily.  ? Zinc 30 MG CAPS Take 30 mg by mouth daily.  ?  ? ?Allergies:   Penicillins and Cephalexin  ? ?Social History  ? ?Socioeconomic History  ? Marital status: Married  ?  Spouse name: Not on file  ? Number of children: Not on file  ? Years of education: Not on file  ? Highest education level: Not on file  ?Occupational History  ? Not on file  ?Tobacco Use  ? Smoking status: Former  ?  Types: Cigarettes  ? Smokeless tobacco: Never  ? Tobacco comments:  ?  quit at age 48  ?Vaping Use  ? Vaping Use: Never used  ?Substance and Sexual Activity  ? Alcohol use: Yes  ?  Comment: Occasionally  ? Drug use: No  ? Sexual activity: Not Currently  ?Other Topics Concern  ? Not on file  ?Social History Narrative  ? Not on file  ? ?Social Determinants of Health  ? ?Financial Resource Strain: Not on file  ?Food Insecurity: Not on file  ?Transportation Needs: Not on file  ?Physical Activity: Not on file  ?Stress: Not on file  ?Social Connections: Not on file  ?  ? ?Family History: ?The patient's family history includes Parkinson's disease in his father. There is no history of  Colon cancer, Stomach cancer, Pancreatic cancer, Esophageal cancer, Rectal cancer, Allergic rhinitis, Asthma, Eczema, or Urticaria. ? ?ROS:   ?Please see the history of present illness.    ?All other systems reviewed and are negative. ? ?EKGs/Labs/Other Studies Reviewed:   ? ?The following studies were reviewed today: ?EKG reveals sinus rhythm nonspecific ST-T changes poor anterior forces. ? ? ?Recent Labs: ?09/17/2021: ALT 18; BUN 18; Creat 1.01; Hemoglobin 16.3; Platelets 242; Potassium 4.2; Sodium 137  ?Recent Lipid Panel ?   ?  Component Value Date/Time  ? CHOL 93 08/28/2021 0928  ? TRIG 44 08/28/2021 0928  ? HDL 42 08/28/2021 0928  ? CHOLHDL 2.2 08/28/2021 0928  ? [place] 38 08/28/2021 0928  ? ? ?Physical Exam:   ? ?VS:  BP 134/72   Pulse 78   Ht '5\' 11"'$  (1.803 m)   Wt 258 lb 1.3 oz (117.1 kg)   SpO2 95%   BMI 35.99 kg/m?    ? ?Wt Readings from Last 3 Encounters:  ?12/15/21 258 lb 1.3 oz (117.1 kg)  ?09/17/21 248 lb (112.5 kg)  ?08/06/21 253 lb (114.8 kg)  ?  ? ?GEN: Patient is in no acute distress ?HEENT: Normal ?NECK: No JVD; No carotid bruits ?LYMPHATICS: No lymphadenopathy ?CARDIAC: Hear sounds regular, 2/6 systolic murmur at the apex. ?RESPIRATORY:  Clear to auscultation without rales, wheezing or rhonchi  ?ABDOMEN: Soft, non-tender, non-distended ?MUSCULOSKELETAL:  No edema; No deformity  ?SKIN: Warm and dry ?NEUROLOGIC:  Alert and oriented x 3 ?PSYCHIATRIC:  Normal affect  ? ?Signed, ?Jenean Lindau, MD  ?12/15/2021 3:54 PM    ?[place]  ?

## 2021-12-15 NOTE — Patient Instructions (Signed)

## 2021-12-18 ENCOUNTER — Ambulatory Visit: Payer: Medicare HMO | Admitting: Medical-Surgical

## 2022-01-13 ENCOUNTER — Other Ambulatory Visit: Payer: Self-pay | Admitting: Medical-Surgical

## 2022-01-28 NOTE — Progress Notes (Unsigned)
   Established Patient Office Visit  Subjective   Patient ID: Danny Morgan. Danny Morgan., male    DOB: [birth year]  Age: 66 y.o. MRN: [id number]  No chief complaint on file.   HPI Pleasant 66 year old male presenting today for follow up on:  DM:  HLD:  Mood/Insomnia:  ROS    Objective:     There were no vitals taken for this visit. {Vitals History (Optional):23777}  Physical Exam   No results found for any visits on 01/29/22.  {Labs (Optional):23779}  The ASCVD Risk score (Arnett DK, et al., 2019) failed to calculate for the following reasons:   The valid total cholesterol range is 130 to 320 mg/dL    Assessment & Plan:   Problem List Items Addressed This Visit       Endocrine   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(HCC)     Other   Anxiety - Primary   Hyperlipidemia   Insomnia    No follow-ups on file.    Christen Butter, NP

## 2022-01-29 ENCOUNTER — Ambulatory Visit (INDEPENDENT_AMBULATORY_CARE_PROVIDER_SITE_OTHER): Payer: Self-pay | Admitting: Medical-Surgical

## 2022-01-29 DIAGNOSIS — E1149 Type 2 diabetes mellitus with other diabetic neurological complication: Secondary | ICD-10-CM

## 2022-01-29 DIAGNOSIS — Z91199 Patient's noncompliance with other medical treatment and regimen due to unspecified reason: Secondary | ICD-10-CM

## 2022-01-29 DIAGNOSIS — F5104 Psychophysiologic insomnia: Secondary | ICD-10-CM

## 2022-01-29 DIAGNOSIS — F419 Anxiety disorder, unspecified: Secondary | ICD-10-CM

## 2022-01-29 DIAGNOSIS — E782 Mixed hyperlipidemia: Secondary | ICD-10-CM

## 2022-02-27 ENCOUNTER — Other Ambulatory Visit: Payer: Self-pay | Admitting: Medical-Surgical

## 2022-03-04 ENCOUNTER — Ambulatory Visit (INDEPENDENT_AMBULATORY_CARE_PROVIDER_SITE_OTHER): Payer: Medicare HMO | Admitting: Pharmacist

## 2022-03-04 ENCOUNTER — Encounter: Payer: Self-pay | Admitting: Pharmacist

## 2022-03-04 DIAGNOSIS — F419 Anxiety disorder, unspecified: Secondary | ICD-10-CM

## 2022-03-04 DIAGNOSIS — E1149 Type 2 diabetes mellitus with other diabetic neurological complication: Secondary | ICD-10-CM

## 2022-03-04 DIAGNOSIS — E782 Mixed hyperlipidemia: Secondary | ICD-10-CM

## 2022-03-04 NOTE — Progress Notes (Signed)
Chronic Care Management Pharmacy Note  03/04/2022 Name:  Danny Morgan. Danny Morgan. MRN:  [id number] DOB:  11-05-1955  Summary: addressed mental health, HLD, and primarily DM. Patient is taking Ozempic '1mg'$  weekly, but stretching out time between doses to help ease his cost. Previously applied for patient assistance, but company noted it was not received and asked for resubmission.  Recommendations/Changes made from today's visit:  Continue current regimen, sent paperwork via mychart for 4081 ozempic application for patient to fill out and return.  Plan: f/u with pharmacist in 1 month  Subjective: Danny Morgan. Danny Morgan. is an 66 y.o. year old male who is a primary patient of Samuel Bouche, NP.  The CCM team was consulted for assistance with disease management and care coordination needs.    Engaged with patient by telephone for follow up visit in response to provider referral for pharmacy case management and/or care coordination services.   Consent to Services:  The patient was given information about Chronic Care Management services, agreed to services, and gave verbal consent prior to initiation of services.  Please see initial visit note for detailed documentation.   Patient Care Team: Samuel Bouche, NP as PCP - General (Nurse Practitioner) Michael Boston, MD as Consulting Physician (General Surgery) Darius Bump, [facility] (Pharmacist)   Objective:  Lab Results  Component Value Date   CREATININE 1.01 09/17/2021   CREATININE 1.02 06/17/2020    Lab Results  Component Value Date   HGBA1C 7.5 (A) 09/17/2021   Last diabetic Eye exam:  Lab Results  Component Value Date/Time   HMDIABEYEEXA No Retinopathy 10/02/2020 12:00 AM    Last diabetic Foot exam: No results found for: "HMDIABFOOTEX"      Component Value Date/Time   CHOL 93 08/28/2021 0928   TRIG 44 08/28/2021 0928   HDL 42 08/28/2021 0928   CHOLHDL 2.2 08/28/2021 0928   LDLCALC 38 08/28/2021 0928       Latest Ref Rng &  Units 09/17/2021   12:00 AM 10/25/2020    8:55 AM 06/17/2020   11:08 AM  Hepatic Function  Total Protein 6.1 - 8.1 g/dL 6.2  6.9  6.8   AST 10 - 35 U/L '17  18  17   '$ ALT 9 - 46 U/L '18  24  25   '$ Total Bilirubin 0.2 - 1.2 mg/dL 0.4  0.3  0.5   Bilirubin, Direct 0.0 - 0.2 mg/dL  0.1      No results found for: "TSH", "FREET4"     Latest Ref Rng & Units 09/17/2021   12:00 AM 10/25/2020    8:55 AM 07/30/2020   12:00 AM  CBC  WBC 3.8 - 10.8 Thousand/uL 7.4  8.0  10.5   Hemoglobin 13.2 - 17.1 g/dL 16.3  15.8  15.6   Hematocrit 38.5 - 50.0 % 50.4  48.2  46.8   Platelets 140 - 400 Thousand/uL 242  252  277    Social History   Tobacco Use  Smoking Status Former   Types: Cigarettes  Smokeless Tobacco Never  Tobacco Comments   quit at age 71   BP Readings from Last 3 Encounters:  12/15/21 134/72  09/17/21 121/68  08/06/21 (!) 147/74   Pulse Readings from Last 3 Encounters:  12/15/21 78  09/17/21 74  08/06/21 74   Wt Readings from Last 3 Encounters:  12/15/21 258 lb 1.3 oz (117.1 kg)  09/17/21 248 lb (112.5 kg)  08/06/21 253 lb (114.8 kg)    Assessment: Review  of patient past medical history, allergies, medications, health status, including review of consultants reports, laboratory and other test data, was performed as part of comprehensive evaluation and provision of chronic care management services.   SDOH:  (Social Determinants of Health) assessments and interventions performed:    CCM Care Plan  Allergies  Allergen Reactions   Penicillins Shortness Of Breath and Swelling   Cephalexin Diarrhea    Medications Reviewed Today     Reviewed by Lowella Grip, CMA (Certified Medical Assistant) on 12/15/21 at 1530  Med List Status: <None>   Medication Order Taking? Sig Documenting Provider Last Dose Status Informant  amitriptyline (ELAVIL) 25 MG tablet [id number]  TAKE 1 TABLET BY MOUTH EVERYDAY AT BEDTIME Samuel Bouche, NP  Active   atorvastatin (LIPITOR) 20 MG tablet  [id number]  Take 1 tablet (20 mg total) by mouth daily. NEEDS LABS FOR FURTHER REFILLS Samuel Bouche, NP  Active   Biotin 5000 MCG TABS [id number]  Take 1 tablet by mouth daily. [provider]  Active   Black Pepper-Turmeric (TURMERIC CURCUMIN) 01-999 MG CAPS [id number]  Take 1 tablet by mouth daily. [provider]  Active   celecoxib (CELEBREX) 200 MG capsule [id number]  One to 2 tablets by mouth daily as needed for pain. Silverio Decamp, MD  Active   cetirizine (ZYRTEC) 10 MG tablet [id number]  Take 10 mg by mouth daily. [provider]  Active   Echinacea 125 MG TABS [id number]  Take 1 tablet by mouth daily. [provider]  Active   ipratropium (ATROVENT) 0.06 % nasal spray [id number]  Place 2 sprays into both nostrils 2 (two) times daily. [provider]  Active   LORazepam (ATIVAN) 1 MG tablet [id number]  Take 1 tablet (1 mg total) by mouth daily as needed for anxiety. Samuel Bouche, NP  Active   Magnesium 200 MG TABS [id number]  Take 1 tablet by mouth at bedtime. [provider]  Active   metFORMIN (GLUCOPHAGE) 1000 MG tablet [id number]  TAKE 1 TABLET BY MOUTH TWICE A DAY Jessup, Joy, NP  Active   montelukast (SINGULAIR) 10 MG tablet [id number]  Take 1 tablet (10 mg total) by mouth at bedtime. Kennith Gain, MD  Active   Omega-3 Fatty Acids (FISH OIL) 1200 MG CAPS [id number]  Take 1,200 mg by mouth daily. [provider]  Active   omeprazole (PRILOSEC) 10 MG capsule [id number]  Take 10 mg by mouth daily.  [provider]  Active   Semaglutide, 1 MG/DOSE, 4 MG/3ML SOPN [id number]  Inject 1 mg as directed once a week. Samuel Bouche, NP  Active   tadalafil (CIALIS) 20 MG tablet [id number]  Take 1 tablet (20 mg total) by mouth daily as needed for erectile dysfunction. Samuel Bouche, NP  Active   Vit C-Cholecalciferol-Rose Hip (VITAMIN C & D3/ROSE HIPS) [phone] MG-UNIT-MG CAPS [id number]  Take 1 tablet by mouth daily. [provider]  Active   Zinc 30 MG CAPS [id number]  Take 30 mg by mouth daily. [provider]  Active             Patient Active Problem List   Diagnosis Date Noted   Eczema 12/15/2021   Obesity (BMI 35.0-39.9 without comorbidity) 12/15/2021   Lumbar spinal stenosis 08/28/2021   Impingement syndrome, shoulder, left 08/28/2021   Epistaxis 11/12/2020   Erectile dysfunction due to type 2 diabetes mellitus ([place]) 11/12/2020   Adjustment disorder with mixed anxiety and depressed mood 09/17/2020   Body  mass index (BMI) 39.0-39.9, adult 09/17/2020   Insomnia 09/17/2020   Neuropathy of both feet 09/17/2020   Palpitations 09/17/2020   Pilar cyst 09/17/2020   Stress due to family tension 09/17/2020   Tubular adenoma of colon 09/17/2020   DOE (dyspnea on exertion) 06/24/2020   Chest discomfort 06/24/2020   Morbid obesity ([place]) 06/24/2020   Cardiac murmur 06/24/2020   Allergy    Anxiety    Cataract    Colon polyps    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(HCC)    GERD (gastroesophageal reflux disease)    Hyperlipidemia     Immunization History  Administered Date(s) Administered   PFIZER(Purple Top)SARS-COV-2 Vaccination 01/29/2020, 02/20/2020    Conditions to be addressed/monitored: HLD, DMII, and Depression  There are no care plans that you recently modified to display for this patient.     Medication Assistance:  TBD  Patient's preferred pharmacy is:  [id number] [place] [id number]: [phone]: [phone] [place]- [place] [address]: [phone]: 3[place]# [address] [place]Phone: [phone]: [phone] Uses pill box? Yes Pt endorses 100% compliance  Follow  Up:  Patient agrees to Care Plan and Follow-up.  Plan: Telephone follow up appointment with care management team member scheduled for:  1 month  KLarinda Buttery PharmD Clinical Pharmacist CCharles [phone]

## 2022-03-04 NOTE — Patient Instructions (Signed)
Visit Information  Thank you for taking time to visit with me today. Please don't hesitate to contact me if I can be of assistance to you before our next scheduled telephone appointment.  Following are the goals we discussed today:  Patient Goals/Self-Care Activities Over the next 30 days, patient will:  take medications as prescribed and collaborate with provider on medication access solutions  Follow Up Plan: Telephone appointment with care management team member scheduled for:1 month  Please call the care guide team at [phone] if you need to cancel or reschedule your appointment.   If you are experiencing a Mental Health or [place] or need someone to talk to, please call the Suicide and Crisis Lifeline: 988 call 1-800-273-TALK (toll free, 24 hour hotline)   Patient verbalizes understanding of instructions and care plan provided today and agrees to view in [place]. Active MyChart status and patient understanding of how to access instructions and care plan via MyChart confirmed with patient.     Darius Bump

## 2022-03-09 ENCOUNTER — Other Ambulatory Visit: Payer: Self-pay | Admitting: Medical-Surgical

## 2022-03-27 ENCOUNTER — Telehealth: Payer: Self-pay | Admitting: Medical-Surgical

## 2022-03-27 NOTE — Telephone Encounter (Signed)
Pt called to cancel his appt.  He only works 3 days a week and can only take off Thursdays and Fridays for appointments.

## 2022-03-31 ENCOUNTER — Telehealth: Payer: Medicare HMO

## 2022-04-03 ENCOUNTER — Other Ambulatory Visit: Payer: Self-pay | Admitting: Medical-Surgical

## 2022-04-03 NOTE — Telephone Encounter (Signed)
Patient needs an appointment for further refills.   Last office visit 09/17/2021  12/18/2021 canceled  01/29/2022 no show  03/31/2022 canceled  Sent 15 tablets to the pharmacy.

## 2022-04-06 NOTE — Telephone Encounter (Signed)
LVM for patient to call back to get appt scheduled for any further med refills. AMUCK

## 2022-04-09 ENCOUNTER — Other Ambulatory Visit: Payer: Self-pay | Admitting: Medical-Surgical

## 2022-04-10 NOTE — Chronic Care Management (AMB) (Signed)
  Chronic Care Management Note  04/10/2022 Name: Danny Morgan. Danny Morgan. MRN: [id number] DOB: October 28, 1955  Danny Morgan. Danny Morgan. is a 66 y.o. year old male who is a primary care patient of Samuel Bouche, NP and is actively engaged with the care management team. I reached out to Danny Morgan. Danny Morgan. by phone today to assist with re-scheduling a follow up visit with the Pharmacist  Follow up plan: Unsuccessful telephone outreach attempt made. A HIPAA compliant phone message was left for the patient providing contact information and requesting a return call.   Julian Hy, [place] Direct Dial: [phone]

## 2022-04-20 NOTE — Chronic Care Management (AMB) (Signed)
  Chronic Care Management Note  04/20/2022 Name: [place] Lohr. [name] MRN: [id number] DOB: 05-24-1956  [name]. Fread Kottke. is a 66 y.o. year old male who is a primary care patient of Samuel Bouche, NP and is actively engaged with the care management team. I reached out to [place] D. [name] by phone today to assist with re-scheduling a follow up visit with the Pharmacist  Follow up plan: Telephone appointment with care management team member scheduled for: 04/22/2022  Julian Hy, [place] Direct Dial: [phone]

## 2022-04-21 ENCOUNTER — Other Ambulatory Visit: Payer: Self-pay | Admitting: Medical-Surgical

## 2022-04-22 ENCOUNTER — Ambulatory Visit (INDEPENDENT_AMBULATORY_CARE_PROVIDER_SITE_OTHER): Payer: Medicare HMO | Admitting: Pharmacist

## 2022-04-22 DIAGNOSIS — F419 Anxiety disorder, unspecified: Secondary | ICD-10-CM

## 2022-04-22 DIAGNOSIS — E782 Mixed hyperlipidemia: Secondary | ICD-10-CM

## 2022-04-22 DIAGNOSIS — E1149 Type 2 diabetes mellitus with other diabetic neurological complication: Secondary | ICD-10-CM

## 2022-04-22 NOTE — Progress Notes (Signed)
Chronic Care Management Pharmacy Note  04/22/2022 Name:  Danny Morgan. Danny Morgan. MRN:  [id number] DOB:  1955/11/22  Summary: addressed mental health, HLD, and primarily DM. Patient is taking Ozempic '1mg'$  weekly, but stretching out time between doses to help ease his cost. Previously applied for patient assistance, but company noted it was not received and asked for resubmission.  Today patient describes financial stress related to house payments, and has not been able to prioritize filling out his patient assistance application.  Recommendations/Changes made from today's visit:  Continue current regimen, left a hard copy at front desk for 4315 ozempic application, for patient to fill out and return.  Plan: f/u with pharmacist in 1 month  Subjective: Danny Morgan. Danny Morgan. is an 66 y.o. year old male who is a primary patient of Samuel Bouche, NP.  The CCM team was consulted for assistance with disease management and care coordination needs.    Engaged with patient by telephone for follow up visit in response to provider referral for pharmacy case management and/or care coordination services.   Consent to Services:  The patient was given information about Chronic Care Management services, agreed to services, and gave verbal consent prior to initiation of services.  Please see initial visit note for detailed documentation.   Patient Care Team: Samuel Bouche, NP as PCP - General (Nurse Practitioner) Michael Boston, MD as Consulting Physician (General Surgery) Darius Bump, [facility] (Pharmacist)   Objective:  Lab Results  Component Value Date   CREATININE 1.01 09/17/2021   CREATININE 1.02 06/17/2020    Lab Results  Component Value Date   HGBA1C 7.5 (A) 09/17/2021   Last diabetic Eye exam:  Lab Results  Component Value Date/Time   HMDIABEYEEXA No Retinopathy 10/02/2020 12:00 AM    Last diabetic Foot exam: No results found for: "HMDIABFOOTEX"      Component Value Date/Time   CHOL  93 08/28/2021 0928   TRIG 44 08/28/2021 0928   HDL 42 08/28/2021 0928   CHOLHDL 2.2 08/28/2021 0928   LDLCALC 38 08/28/2021 0928       Latest Ref Rng & Units 09/17/2021   12:00 AM 10/25/2020    8:55 AM 06/17/2020   11:08 AM  Hepatic Function  Total Protein 6.1 - 8.1 g/dL 6.2  6.9  6.8   AST 10 - 35 U/L '17  18  17   '$ ALT 9 - 46 U/L '18  24  25   '$ Total Bilirubin 0.2 - 1.2 mg/dL 0.4  0.3  0.5   Bilirubin, Direct 0.0 - 0.2 mg/dL  0.1      No results found for: "TSH", "FREET4"     Latest Ref Rng & Units 09/17/2021   12:00 AM 10/25/2020    8:55 AM 07/30/2020   12:00 AM  CBC  WBC 3.8 - 10.8 Thousand/uL 7.4  8.0  10.5   Hemoglobin 13.2 - 17.1 g/dL 16.3  15.8  15.6   Hematocrit 38.5 - 50.0 % 50.4  48.2  46.8   Platelets 140 - 400 Thousand/uL 242  252  277    Social History   Tobacco Use  Smoking Status Former   Types: Cigarettes  Smokeless Tobacco Never  Tobacco Comments   quit at age 55   BP Readings from Last 3 Encounters:  12/15/21 134/72  09/17/21 121/68  08/06/21 (!) 147/74   Pulse Readings from Last 3 Encounters:  12/15/21 78  09/17/21 74  08/06/21 74   Wt Readings from Last 3  Encounters:  12/15/21 258 lb 1.3 oz (117.1 kg)  09/17/21 248 lb (112.5 kg)  08/06/21 253 lb (114.8 kg)    Assessment: Review of patient past medical history, allergies, medications, health status, including review of consultants reports, laboratory and other test data, was performed as part of comprehensive evaluation and provision of chronic care management services.   SDOH:  (Social Determinants of Health) assessments and interventions performed:    CCM Care Plan  Allergies  Allergen Reactions   Penicillins Shortness Of Breath and Swelling   Cephalexin Diarrhea    Medications Reviewed Today     Reviewed by Darius Bump, [facility] (Pharmacist) on 04/22/22 at 1321  Med List Status: <None>   Medication Order Taking? Sig Documenting Provider Last Dose Status Informant  amitriptyline  (ELAVIL) 25 MG tablet [id number] Yes TAKE 1 TABLET BY MOUTH EVERYDAY AT BEDTIME Samuel Bouche, NP Taking Active   atorvastatin (LIPITOR) 20 MG tablet [id number] Yes TAKE 1 TABLET (20 MG TOTAL) BY MOUTH DAILY. NEEDS FOLLOW UP APPOINTMENT. Samuel Bouche, NP Taking Active   Biotin 5000 MCG TABS [id number] Yes Take 1 tablet by mouth daily. [provider] Taking Active   celecoxib (CELEBREX) 200 MG capsule [id number] No One to 2 tablets by mouth daily as needed for pain.  Patient not taking: Reported on 03/04/2022   Silverio Decamp, MD Not Taking Active   cetirizine (ZYRTEC) 10 MG tablet [id number] Yes Take 10 mg by mouth daily. [provider] Taking Active   ipratropium (ATROVENT) 0.06 % nasal spray [id number] Yes Place 2 sprays into both nostrils 2 (two) times daily. [provider] Taking Active   LORazepam (ATIVAN) 1 MG tablet [id number] Yes Take 1 tablet (1 mg total) by mouth daily as needed for anxiety. Samuel Bouche, NP Taking Active   Magnesium 200 MG TABS [id number] Yes Take 1 tablet by mouth at bedtime. [provider] Taking Active   metFORMIN (GLUCOPHAGE) 1000 MG tablet [id number] Yes TAKE 1 TABLET BY MOUTH TWICE A DAY Jessup, Joy, NP Taking Active   montelukast (SINGULAIR) 10 MG tablet [id number] No Take 1 tablet (10 mg total) by mouth at bedtime.  Patient not taking: Reported on 03/04/2022   Kennith Gain, MD Not Taking Active   Multiple Vitamins-Minerals (CENTRUM SILVER 50+MEN PO) [id number] Yes Take 1 tablet by mouth daily. [provider] Taking Active   omeprazole (PRILOSEC) 10 MG capsule [id number] Yes Take 10 mg by mouth daily.  [provider] Taking Active   Semaglutide, 1 MG/DOSE, (OZEMPIC, 1 MG/DOSE,) 4 MG/3ML SOPN [id number] Yes INJECT 1 MG ONCE A WEEK AS DIRECTED Samuel Bouche, NP Taking Active   tadalafil (CIALIS) 20 MG tablet [id number] Yes Take 1 tablet (20 mg total) by mouth daily as needed for erectile dysfunction. Samuel Bouche, NP Taking Active             Patient Active Problem List   Diagnosis Date Noted   Eczema 12/15/2021   Obesity (BMI 35.0-39.9 without comorbidity) 12/15/2021   Lumbar spinal stenosis 08/28/2021   Impingement syndrome, shoulder, left 08/28/2021   Epistaxis 11/12/2020   Erectile dysfunction due to type 2 diabetes mellitus ([place]) 11/12/2020   Adjustment disorder with mixed anxiety and depressed mood 09/17/2020   Body mass index (BMI) 39.0-39.9, adult 09/17/2020   Insomnia 09/17/2020   Neuropathy of both feet 09/17/2020   Palpitations 09/17/2020   Pilar cyst 09/17/2020   Stress due to family tension 09/17/2020   Tubular adenoma of colon 09/17/2020  DOE (dyspnea on exertion) 06/24/2020   Chest discomfort 06/24/2020   Morbid obesity ([place]) 06/24/2020   Cardiac murmur 06/24/2020   Allergy    Anxiety    Cataract    Colon polyps    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(HCC)    GERD (gastroesophageal reflux disease)    Hyperlipidemia     Immunization History  Administered Date(s) Administered   PFIZER(Purple Top)SARS-COV-2 Vaccination 01/29/2020, 02/20/2020    Conditions to be addressed/monitored: HLD, DMII, and Depression  Care Plan : Medication Management  Updates made by Darius Bump, [place] since 04/22/2022 12:00 AM     Problem: HLD, DM, mental health      Long-Range Goal: Disease Progression Prevention   Start Date: 05/01/2021  Recent Progress: On track  Priority: High  Note:   Current Barriers:  Unable to independently afford treatment regimen  Pharmacist Clinical Goal(s):  Over the next 30 days, patient will verbalize ability to afford treatment regimen through collaboration with PharmD and provider.   Interventions: 1:1 collaboration with Samuel Bouche, NP regarding development and update of comprehensive plan of care as evidenced by provider attestation and co-signature Inter-disciplinary care team collaboration (see longitudinal plan  of care) Comprehensive medication review performed; medication list updated in electronic medical record  Diabetes:  Controlled; current treatment: metformin '1000mg'$  BID, ozempic '1mg'$  weekly;  Current glucose readings: not currently checking  Denies hypoglycemic/hyperglycemic symptoms  Current meal patterns: to be discussed at future visits  Current exercise: renovating a house, still working  Recommended continue current regimen. Will resubmit application for Ozempic PAP as of 03/04/22  Hyperlipidemia:  Controlled; current treatment:atorvastatin '20mg'$  daily, reduced by PCP d/t LDL 38  Recommended continue current regimen  Mental Health  Uncontrolled; current treatment: ativan PRN anxiety, amitryptyline '25mg'$  for sleep  Recommended patient get connected with couples and/or individual therapy, as well as consider maintenance medication such as SSRI.   Patient Goals/Self-Care Activities Over the next 30 days, patient will:  take medications as prescribed and collaborate with provider on medication access solutions   Follow Up Plan: Telephone appointment with care management team member scheduled for:1 month       Medication Assistance:  TBD  Patient's preferred pharmacy is:  [id number] [place]Phone: [phone]: [phone]- [place]Phone: [phone]: [phone] [facility] [address] [place] [place]Phone: [phone]: [phone] Uses pill box? Yes Pt endorses 100% compliance  Follow Up:  Patient agrees to Care Plan and Follow-up.  Plan: Telephone follow up appointment with care management team member scheduled for:  1 month  KLarinda Buttery PharmD Clinical Pharmacist [facility] At [phone]

## 2022-04-22 NOTE — Telephone Encounter (Signed)
Patient has been scheduled for 9/15. AMUCK

## 2022-04-22 NOTE — Patient Instructions (Signed)
Visit Information  Thank you for taking time to visit with me today. Please don't hesitate to contact me if I can be of assistance to you before our next scheduled telephone appointment.  Following are the goals we discussed today:   Patient Goals/Self-Care Activities Over the next 30 days, patient will:  take medications as prescribed and collaborate with provider on medication access solutions   Follow Up Plan: Telephone appointment with care management team member scheduled for:1 month  Please call the care guide team at [phone] if you need to cancel or reschedule your appointment.   If you are experiencing a Mental Health or [place] or need someone to talk to, please call the Suicide and Crisis Lifeline: 988 call 1-800-273-TALK (toll free, 24 hour hotline)   Patient verbalizes understanding of instructions and care plan provided today and agrees to view in [place]. Active MyChart status and patient understanding of how to access instructions and care plan via MyChart confirmed with patient.     Darius Bump

## 2022-04-22 NOTE — Telephone Encounter (Signed)
Patient needs appointment for further refills.  Last office visit 09/17/2021  Future appointment 12/18/2021 canceled  No future appointment scheduled  Last filled 01/14/2022  Sent 30 day supply to the pharmacy. No refills

## 2022-04-29 ENCOUNTER — Encounter: Payer: Self-pay | Admitting: General Practice

## 2022-05-06 ENCOUNTER — Other Ambulatory Visit: Payer: Self-pay | Admitting: Medical-Surgical

## 2022-05-07 DIAGNOSIS — E1169 Type 2 diabetes mellitus with other specified complication: Secondary | ICD-10-CM

## 2022-05-07 DIAGNOSIS — Z7985 Long-term (current) use of injectable non-insulin antidiabetic drugs: Secondary | ICD-10-CM

## 2022-05-07 DIAGNOSIS — E785 Hyperlipidemia, unspecified: Secondary | ICD-10-CM | POA: Diagnosis not present

## 2022-05-07 DIAGNOSIS — Z7984 Long term (current) use of oral hypoglycemic drugs: Secondary | ICD-10-CM

## 2022-05-08 ENCOUNTER — Other Ambulatory Visit: Payer: Self-pay | Admitting: Medical-Surgical

## 2022-05-21 ENCOUNTER — Telehealth (INDEPENDENT_AMBULATORY_CARE_PROVIDER_SITE_OTHER): Payer: Self-pay | Admitting: Medical-Surgical

## 2022-05-21 DIAGNOSIS — Z91199 Patient's noncompliance with other medical treatment and regimen due to unspecified reason: Secondary | ICD-10-CM

## 2022-05-22 ENCOUNTER — Ambulatory Visit: Payer: Medicare HMO | Admitting: Medical-Surgical
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
## 2022-05-22 IMAGING — DX DG SHOULDER 2+V*L*
4 series · 4 of 4 positions shown · non-contrast
Comparison: None.

CLINICAL DATA: Pain.

EXAM:
LEFT SHOULDER - 2+ VIEW

[shoulder grashey]
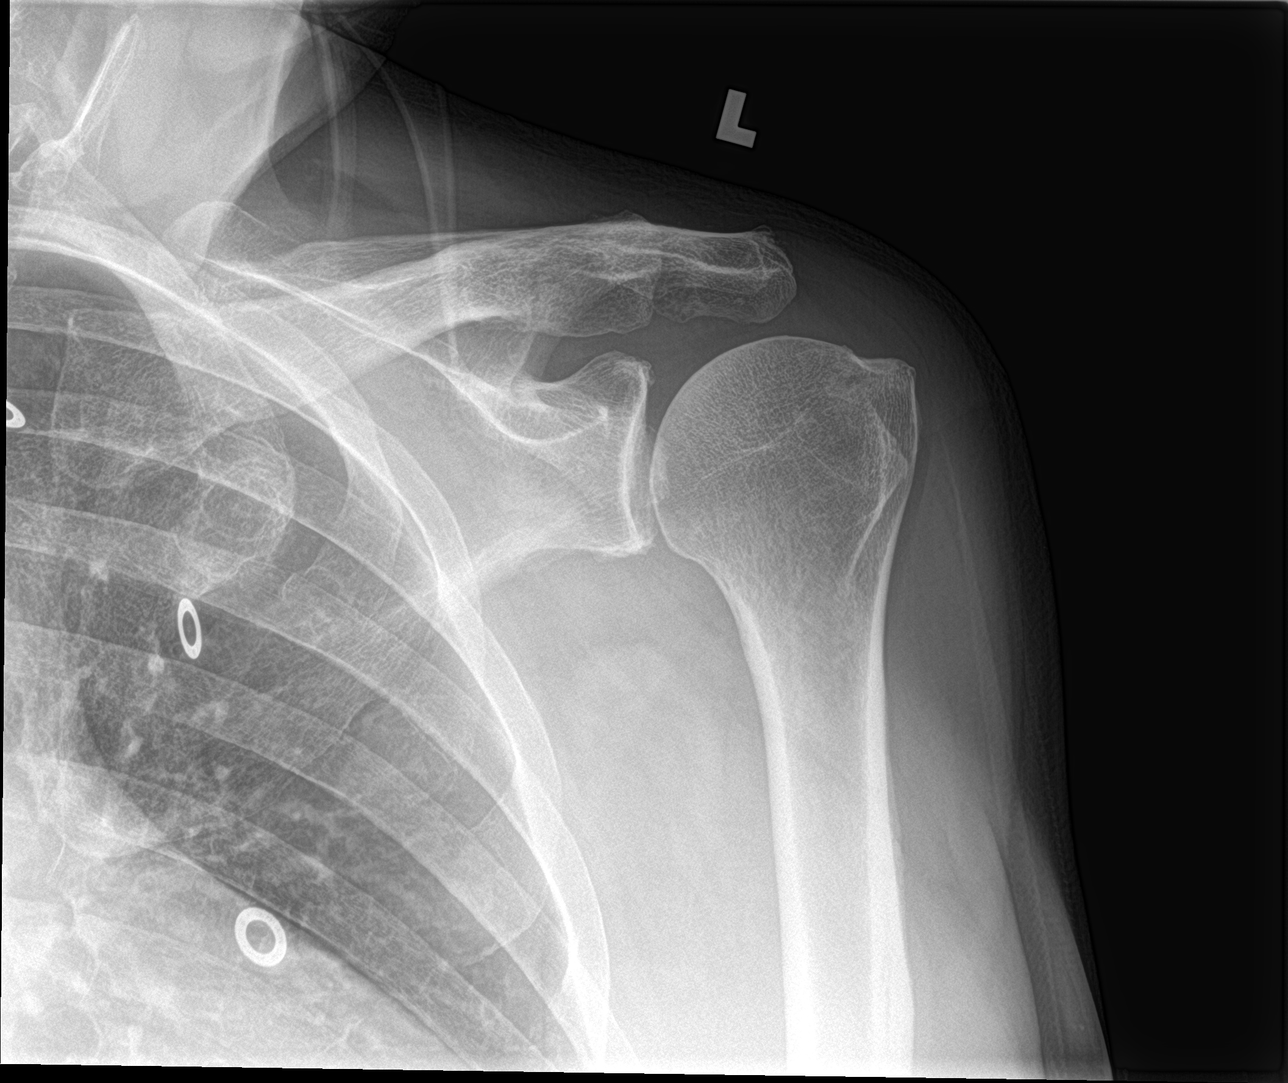

[shoulder y view]
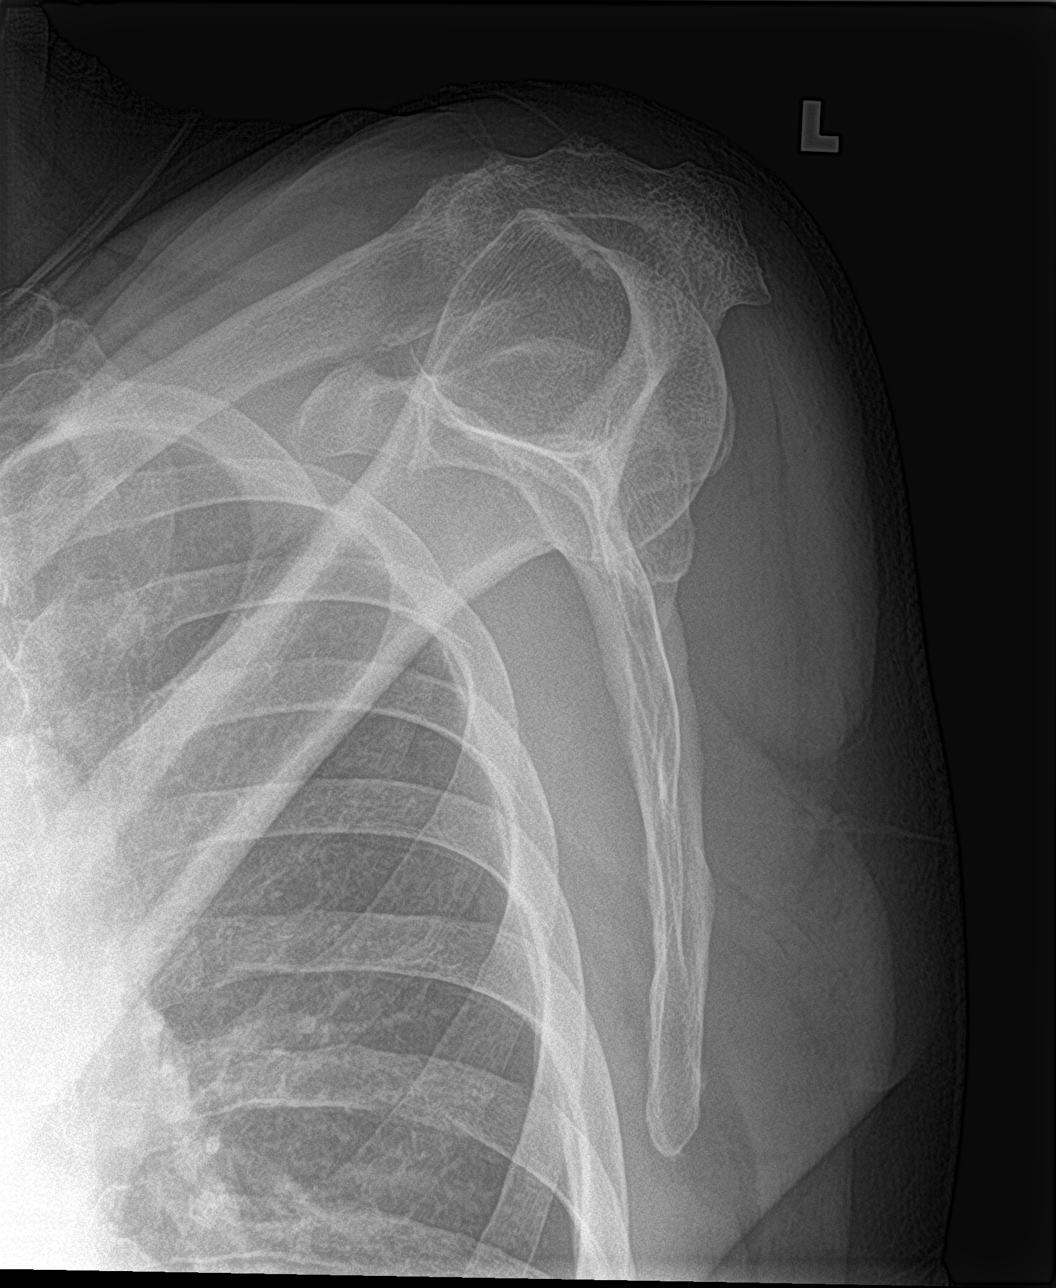

[shoulder axillary]
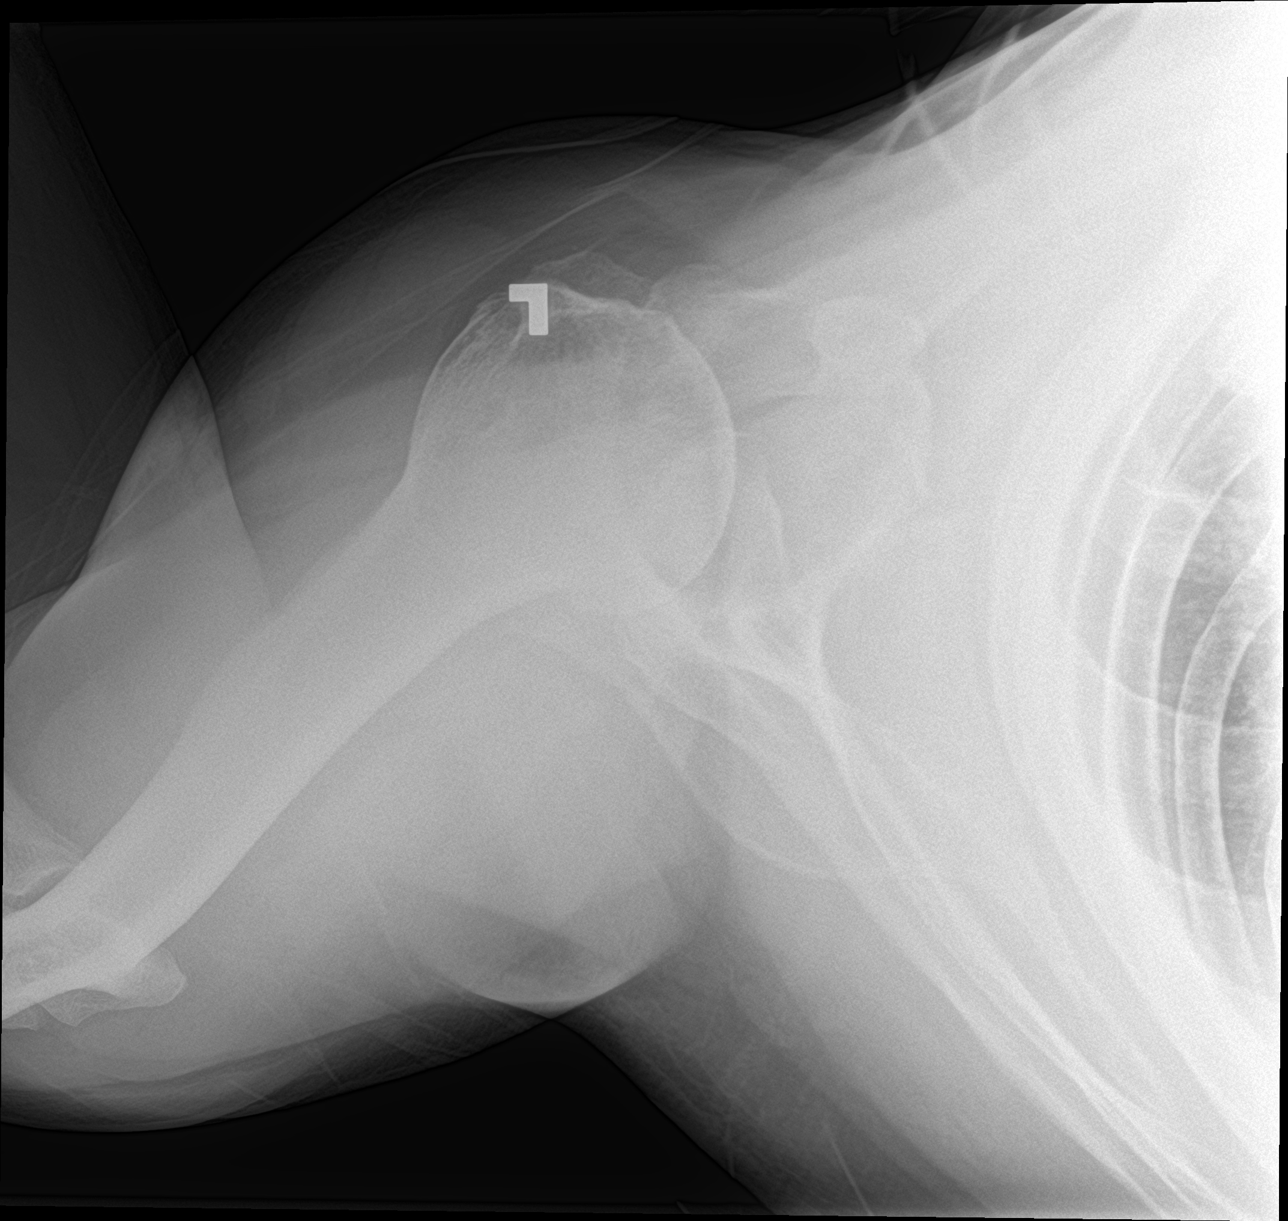

[shoulder ap neutral]
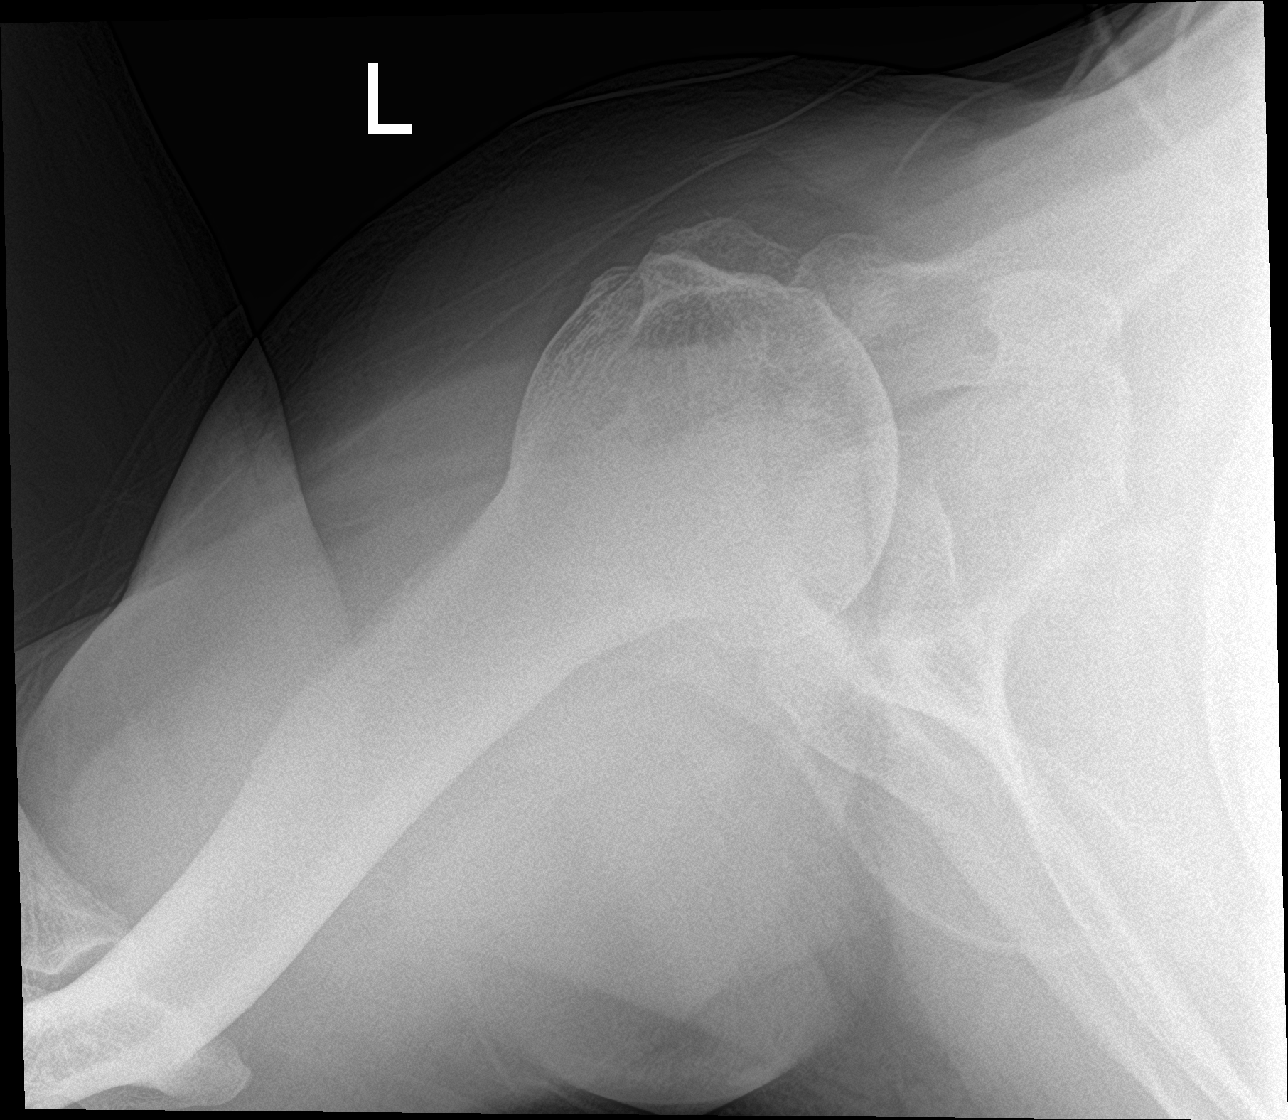

[4 of 4 positions shown; findings below may reference images not displayed]

FINDINGS: Mild glenohumeral degenerative changes.  No fracture or dislocation.
IMPRESSION: Mild glenohumeral degenerative changes.  No acute abnormalities.

## 2022-05-22 NOTE — Progress Notes (Signed)
Attempted to contact patient prior to lunch but no answer.  Provider connected with virtual waiting room at 1 PM and sent a text link for patient to join.  Provider waited in the virtual waiting room until after 1:15 PM then tried to call patient on the number listed in his chart.  No answer was received and a voicemail was left advising him that he will need to reschedule his appointment in order to complete his follow-up. ___________________________________________ Clearnce Sorrel, DNP, APRN, FNP-BC Primary Care and [place]

## 2022-05-26 ENCOUNTER — Encounter: Payer: Self-pay | Admitting: Medical-Surgical

## 2022-05-26 ENCOUNTER — Telehealth (INDEPENDENT_AMBULATORY_CARE_PROVIDER_SITE_OTHER): Payer: Medicare HMO | Admitting: Medical-Surgical

## 2022-05-26 DIAGNOSIS — F419 Anxiety disorder, unspecified: Secondary | ICD-10-CM | POA: Diagnosis not present

## 2022-05-26 DIAGNOSIS — R69 Illness, unspecified: Secondary | ICD-10-CM | POA: Diagnosis not present

## 2022-05-26 DIAGNOSIS — F5104 Psychophysiologic insomnia: Secondary | ICD-10-CM | POA: Diagnosis not present

## 2022-05-26 MED ORDER — AMITRIPTYLINE HCL 25 MG PO TABS
25.0000 mg | ORAL_TABLET | Freq: Every day | ORAL | 1 refills | Status: DC
Start: 2022-05-26 — End: 2022-12-01

## 2022-05-26 MED ORDER — CETIRIZINE HCL 10 MG PO TABS: 10.0000 mg | ORAL_TABLET | Freq: Every day | ORAL | 1 refills | Status: DC

## 2022-05-26 NOTE — Progress Notes (Unsigned)
Virtual Visit via Video Note  I connected with Danny Morgan. Danny Morgan. on 05/27/22 at  1:00 PM EDT by a video enabled telemedicine application and verified that I am speaking with the correct person using two identifiers.   I discussed the limitations of evaluation and management by telemedicine and the availability of in person appointments. The patient expressed understanding and agreed to proceed.  Patient location: home Provider locations: office  Subjective:    CC: Anxiety/insomnia follow-up  HPI: Pleasant 66 year old male presenting via [place] video visit for follow-up on anxiety/insomnia.  Has been taking amitriptyline 25 mg nightly, tolerating well without side effects.  He is content with the current medication at his current dose and is not sure that he would want to increase the dose at this point.  Does feel like the medication has helped a little bit with his mood management however he has a lot going on.  He is still having issues with his daughter and they are also trying to get the house ready for sale.  He does still endorse significant anxiety and depression symptoms but feels this is more situational.  Denies SI/HI.  Past medical history, Surgical history, Family history not pertinant except as noted below, Social history, Allergies, and medications have been entered into the medical record, reviewed, and corrections made.   Review of Systems: See HPI for pertinent positives and negatives.   Objective:    General: Speaking clearly in complete sentences without any shortness of breath.  Alert and oriented x3.  Normal judgment. No apparent acute distress.  Impression and Recommendations:    1. Psychophysiological insomnia 2. Anxiety Continue amitriptyline 25 mg daily.  If interested, we could increase this to 50 mg daily to see if it helps better with sleep as well as helps to control his mood.  He will let me know if he would like to do this in the future.  I discussed  the assessment and treatment plan with the patient. The patient was provided an opportunity to ask questions and all were answered. The patient agreed with the plan and demonstrated an understanding of the instructions.   The patient was advised to call back or seek an in-person evaluation if the symptoms worsen or if the condition fails to improve as anticipated.  25 minutes of non-face-to-face time was provided during this encounter.  Return in about 3 months (around 08/25/2022) for mood follow up.  Clearnce Sorrel, DNP, APRN, FNP-BC [place] [facility] and [facility]

## 2022-05-27 ENCOUNTER — Other Ambulatory Visit: Payer: Self-pay | Admitting: Medical-Surgical

## 2022-07-14 ENCOUNTER — Other Ambulatory Visit: Payer: Self-pay | Admitting: Medical-Surgical

## 2022-07-14 NOTE — Telephone Encounter (Signed)
LVM for patient to call back to schedule a mood/HLD follow up. tvt

## 2022-08-18 ENCOUNTER — Telehealth: Payer: Self-pay | Admitting: Medical-Surgical

## 2022-08-18 NOTE — Telephone Encounter (Signed)
Called patient left voicemail to call office to schedule appointment!

## 2022-08-27 ENCOUNTER — Other Ambulatory Visit: Payer: Self-pay | Admitting: Medical-Surgical

## 2022-09-11 ENCOUNTER — Ambulatory Visit (INDEPENDENT_AMBULATORY_CARE_PROVIDER_SITE_OTHER): Payer: Medicare HMO | Admitting: Medical-Surgical

## 2022-09-11 ENCOUNTER — Ambulatory Visit: Payer: Medicare HMO | Attending: Medical-Surgical

## 2022-09-11 ENCOUNTER — Encounter: Payer: Self-pay | Admitting: Medical-Surgical

## 2022-09-11 VITALS — BP 121/68 | HR 68 | Resp 20 | Ht 71.0 in | Wt 261.8 lb

## 2022-09-11 DIAGNOSIS — E1149 Type 2 diabetes mellitus with other diabetic neurological complication: Secondary | ICD-10-CM | POA: Diagnosis not present

## 2022-09-11 DIAGNOSIS — N521 Erectile dysfunction due to diseases classified elsewhere: Secondary | ICD-10-CM | POA: Diagnosis not present

## 2022-09-11 DIAGNOSIS — E1169 Type 2 diabetes mellitus with other specified complication: Secondary | ICD-10-CM | POA: Diagnosis not present

## 2022-09-11 DIAGNOSIS — E782 Mixed hyperlipidemia: Secondary | ICD-10-CM | POA: Diagnosis not present

## 2022-09-11 DIAGNOSIS — R002 Palpitations: Secondary | ICD-10-CM | POA: Diagnosis not present

## 2022-09-11 MED ORDER — IPRATROPIUM BROMIDE 0.06 % NA SOLN: 2.0000 | Freq: Two times a day (BID) | NASAL | 6 refills | Status: DC

## 2022-09-11 NOTE — Progress Notes (Unsigned)
Enrolled patient for a 14 day Zio XT  monitor to be mailed to patients home  °

## 2022-09-11 NOTE — Progress Notes (Unsigned)
   Established Patient Office Visit  Subjective   Patient ID: Danny Morgan. Danny Morgan., male   DOB: 27-Jul-1956 Age: 67 y.o. MRN: [id number]   No chief complaint on file.  HPI    Objective:    There were no vitals filed for this visit.  Physical Exam   No results found for this or any previous visit (from the past 24 hour(s)).   {Labs (Optional):23779}  The ASCVD Risk score (Arnett DK, et al., 2019) failed to calculate for the following reasons:   The valid total cholesterol range is 130 to 320 mg/dL   Assessment & Plan:   No problem-specific Assessment & Plan notes found for this encounter.   No follow-ups on file.  ___________________________________________ Clearnce Sorrel, DNP, APRN, FNP-BC Primary Care and [place]

## 2022-09-12 LAB — HEMOGLOBIN A1C
Hgb A1c MFr Bld: 9 % of total Hgb — ABNORMAL HIGH (ref <5.7)
Mean Plasma Glucose: 212 mg/dL
eAG (mmol/L): 11.7 mmol/L

## 2022-09-12 LAB — CBC WITH DIFFERENTIAL/PLATELET
Absolute Monocytes: 590 cells/uL (ref 200–950)
Basophils Absolute: 49 cells/uL (ref 0–200)
Basophils Relative: 0.6 %
Eosinophils Absolute: 320 cells/uL (ref 15–500)
Eosinophils Relative: 3.9 %
HCT: 51.2 % — ABNORMAL HIGH (ref 38.5–50.0)
Hemoglobin: 16.7 g/dL (ref 13.2–17.1)
Lymphs Abs: 2091 cells/uL (ref 850–3900)
MCH: 26.2 pg — ABNORMAL LOW (ref 27.0–33.0)
MCHC: 32.6 g/dL (ref 32.0–36.0)
MCV: 80.4 fL (ref 80.0–100.0)
MPV: 11.3 fL (ref 7.5–12.5)
Monocytes Relative: 7.2 %
Neutro Abs: 5150 cells/uL (ref 1500–7800)
Neutrophils Relative %: 62.8 %
Platelets: 238 10*3/uL (ref 140–400)
RBC: 6.37 10*6/uL — ABNORMAL HIGH (ref 4.20–5.80)
RDW: 13 % (ref 11.0–15.0)
Total Lymphocyte: 25.5 %
WBC: 8.2 10*3/uL (ref 3.8–10.8)

## 2022-09-12 LAB — LIPID PANEL
Cholesterol: 143 mg/dL (ref <200)
HDL: 50 mg/dL (ref >=40)
LDL Cholesterol (Calc): 73 mg/dL (calc)
Non-HDL Cholesterol (Calc): 93 mg/dL (calc) (ref <130)
Total CHOL/HDL Ratio: 2.9 (calc) (ref <5.0)
Triglycerides: 121 mg/dL (ref <150)

## 2022-09-12 LAB — COMPLETE METABOLIC PANEL WITH GFR
AG Ratio: 1.5 (calc) (ref 1.0–2.5)
ALT: 33 U/L (ref 9–46)
AST: 23 U/L (ref 10–35)
Albumin: 4.4 g/dL (ref 3.6–5.1)
Alkaline phosphatase (APISO): 86 U/L (ref 35–144)
BUN: 20 mg/dL (ref 7–25)
CO2: 20 mmol/L (ref 20–32)
Calcium: 10.2 mg/dL (ref 8.6–10.3)
Chloride: 101 mmol/L (ref 98–110)
Creat: 1.03 mg/dL (ref 0.70–1.35)
Globulin: 2.9 g/dL (calc) (ref 1.9–3.7)
Glucose, Bld: 200 mg/dL — ABNORMAL HIGH (ref 65–99)
Potassium: 4.6 mmol/L (ref 3.5–5.3)
Sodium: 138 mmol/L (ref 135–146)
Total Bilirubin: 0.5 mg/dL (ref 0.2–1.2)
Total Protein: 7.3 g/dL (ref 6.1–8.1)
eGFR: 80 mL/min/{1.73_m2} (ref >=60)

## 2022-09-12 LAB — TSH: TSH: 1.78 mIU/L (ref 0.40–4.50)

## 2022-09-12 LAB — MAGNESIUM: Magnesium: 2.2 mg/dL (ref 1.5–2.5)

## 2022-09-15 ENCOUNTER — Encounter: Payer: Self-pay | Admitting: Urology

## 2022-09-15 ENCOUNTER — Telehealth: Payer: Self-pay

## 2022-09-15 ENCOUNTER — Ambulatory Visit: Payer: Medicare HMO | Admitting: Urology

## 2022-09-15 VITALS — BP 141/83 | HR 92 | Ht 70.0 in | Wt 256.0 lb

## 2022-09-15 DIAGNOSIS — E1169 Type 2 diabetes mellitus with other specified complication: Secondary | ICD-10-CM | POA: Diagnosis not present

## 2022-09-15 DIAGNOSIS — N521 Erectile dysfunction due to diseases classified elsewhere: Secondary | ICD-10-CM | POA: Diagnosis not present

## 2022-09-15 NOTE — Progress Notes (Signed)
Assessment: 1. Erectile dysfunction associated with type 2 diabetes mellitus ([place])     Plan: I personally reviewed the patient's chart including provider notes, lab results, and notes from [facility]. Today I had a long discussion with the patient spending a total of 10 minutes discussing ED.  I discussed the pathophysiology, etiology, and natural history of ED as well as management options using a goal-oriented approach.  We discussed the following options: Medical therapy, vacuum erection device, penile injections, intraurethral suppository therapy (MUSE), and penile prosthesis. We discussed possible penile prosthesis.  I advised him that his diabetes should be well-controlled prior to consideration for this procedure due to increased risk of infection and need for removal of the prosthesis. He is interested in resuming intracavernosal injections. Will send rx for TriMix 06/07/29 to [place] resuming prior dose of TriMix Return to office in 3 months.  Chief Complaint:  Chief Complaint  Patient presents with   Erectile Dysfunction    History of Present Illness:  Danny Morgan. Danny Morgan. is a 67 y.o. male who is seen in consultation from Samuel Bouche, NP for evaluation of erectile dysfunction.  He reports symptoms x 5 years.  He has difficulty achieving a adequate erection for intercourse.  He is able to ejaculate with masturbation.  He has done at had sexual relations with his wife for over 10 years.  He currently has a partner and is interested in achieving an erection satisfactory for intercourse.  He has previously tried and failed sildenafil and tadalafil.  He is also previously used a vacuum erection device without benefit.  He was evaluated by Dr. Rexene Alberts at [facility] in April 2022.  He was started on intracavernosal injections with Trimix 06/07/29 at that time.  He reports that he was using "a full syringe" with satisfactory results.  He last used  injection therapy over 6 months ago.  No side effects from the injections.  He reports that his erections lasted approximately 1 hour.  He denies any pain or curvature with erection.  No decrease in his libido. His prior risk factor for erectile dysfunction is diabetes.  His diabetes is currently poorly controlled.  Recent hemoglobin A1c was elevated at 9.0.  He does not have any significant lower urinary tract symptoms.  He reports occasional frequency, urgency, and nocturia.  No dysuria or gross hematuria. IPSS = 4 today.  PSA from 4/22: 0.75.    Past Medical History:  Past Medical History:  Diagnosis Date   Adjustment disorder with mixed anxiety and depressed mood 09/17/2020   Allergy    Angio-edema    Anxiety    Body mass index (BMI) 39.0-39.9, adult 09/17/2020   Cardiac murmur 06/24/2020   Cataract    beginning stage in rt. eye   Chest discomfort 06/24/2020   Colon polyps    Diabetes mellitus without complication ([place])    DOE (dyspnea on exertion) 06/24/2020   Eczema    Epistaxis 11/12/2020   Erectile dysfunction due to type 2 diabetes mellitus ([place]) 11/12/2020   GERD (gastroesophageal reflux disease)    Hyperlipidemia    states cholesterol normal states being given to protect kidneys   Impingement syndrome, shoulder, left 08/28/2021   Insomnia 09/17/2020   Lumbar spinal stenosis 08/28/2021   Morbid obesity ([place]) 06/24/2020   Neuropathy of both feet 09/17/2020   Palpitations 09/17/2020   Pilar cyst 09/17/2020   Stress due to family tension 09/17/2020   Tubular adenoma of colon 09/17/2020  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[facility])     Past Surgical History:  Past Surgical History:  Procedure Laterality Date   COLONOSCOPY W/ BIOPSIES AND POLYPECTOMY     RETINAL DETACHMENT SURGERY     age 69   WISDOM TOOTH EXTRACTION      Allergies:  Allergies  Allergen Reactions   Penicillins Shortness Of Breath and Swelling   Cephalexin Diarrhea    Family  History:  Family History  Problem Relation Age of Onset   Parkinson's disease Father    Colon cancer Neg Hx    Stomach cancer Neg Hx    Pancreatic cancer Neg Hx    Esophageal cancer Neg Hx    Rectal cancer Neg Hx    Allergic rhinitis Neg Hx    Asthma Neg Hx    Eczema Neg Hx    Urticaria Neg Hx     Social History:  Social History   Tobacco Use   Smoking status: Former    Types: Cigarettes   Smokeless tobacco: Never   Tobacco comments:    quit at age 67  Vaping Use   Vaping Use: Never used  Substance Use Topics   Alcohol use: Yes    Comment: Occasionally   Drug use: No    Review of symptoms:  Constitutional:  Negative for unexplained weight loss, night sweats, fever, chills ENT:  Negative for nose bleeds, sinus pain, painful swallowing CV:  Negative for chest pain, shortness of breath, exercise intolerance, palpitations, loss of consciousness Resp:  Negative for cough, wheezing, shortness of breath GI:  Negative for nausea, vomiting, diarrhea, bloody stools GU:  Positives noted in HPI; otherwise negative for gross hematuria, dysuria, urinary incontinence Neuro:  Negative for seizures, poor balance, limb weakness, slurred speech Psych:  Negative for lack of energy, depression, anxiety Endocrine:  Negative for polydipsia, polyuria, symptoms of hypoglycemia (dizziness, hunger, sweating) Hematologic:  Negative for anemia, purpura, petechia, prolonged or excessive bleeding, use of anticoagulants  Allergic:  Negative for difficulty breathing or choking as a result of exposure to anything; no shellfish allergy; no allergic response (rash/itch) to materials, foods  Physical exam: BP (!) 141/83   Pulse 92   Ht '5\' 10"'$  (1.778 m)   Wt 256 lb (116.1 kg)   BMI 36.73 kg/m  GENERAL APPEARANCE:  Well appearing, well developed, well nourished, NAD HEENT: Atraumatic, Normocephalic, oropharynx clear. NECK: Supple without lymphadenopathy or thyromegaly. LUNGS: Clear to auscultation  bilaterally. HEART: Regular Rate and Rhythm without murmurs, gallops, or rubs. ABDOMEN: Soft, non-tender, No Masses. EXTREMITIES: Moves all extremities well.  Without clubbing, cyanosis, or edema. NEUROLOGIC:  Alert and oriented x 3, normal gait, CN II-XII grossly intact.  MENTAL STATUS:  Appropriate. BACK:  Non-tender to palpation.  No CVAT SKIN:  Warm, dry and intact.   GU: Penis:  circumcised Meatus: Normal Scrotum: normal, no masses Testis: normal without masses bilateral   Results: None

## 2022-09-15 NOTE — Telephone Encounter (Signed)
Initiated Prior authorization CBJ:[id number] (1 MG/DOSE) '4MG'$ /3ML pen-injectors Via: Covermymeds Case/Key:[id number] Status: n/a as of 09/15/21 Reason:The patient currently has access to the requested medication and a Prior Authorization is not needed for the patient/medication. Notified Pt via: Mychart

## 2022-09-16 MED ORDER — TIRZEPATIDE 5 MG/0.5ML ~~LOC~~ SOAJ: 5.0000 mg | SUBCUTANEOUS | 0 refills | Status: DC

## 2022-09-16 NOTE — Telephone Encounter (Signed)
We can certainly switch over [facility].  I have sent a prescription for Mounjaro 5 mg weekly to the pharmacy on file.  If needed, we can provide up to 2 boxes of Ozempic samples.  We do not have the 1 mg dose so he would have to do 2 injections weekly of 0.5 mg each.  If we can get [facility] covered, we will not need to continue Ozempic. ___________________________________________ Clearnce Sorrel, DNP, APRN, FNP-BC Primary Care and [place]

## 2022-09-16 NOTE — Telephone Encounter (Signed)
Initiated Prior authorization [id number] '5MG'$ /0.5ML pen-injectors Via: Covermymeds Case/Key:[id number] Status: approved  as of 09/16/21 Reason:The patient currently has access to the requested medication and a Prior Authorization is not needed for the patient/medication. Notified Pt via: Mychart , [facility] they have to order the medication, pt will receive a text when its ready  for pick up,

## 2022-09-16 NOTE — Addendum Note (Signed)
Addended bySamuel Bouche on: 09/16/2022 12:55 PM   Modules accepted: Orders

## 2022-09-17 MED ORDER — OZEMPIC (1 MG/DOSE) 4 MG/3ML ~~LOC~~ SOPN: PEN_INJECTOR | SUBCUTANEOUS | 1 refills | Status: DC

## 2022-09-17 NOTE — Addendum Note (Signed)
Addended bySamuel Bouche on: 09/17/2022 05:17 PM   Modules accepted: Orders

## 2022-09-17 NOTE — Telephone Encounter (Signed)
Called pharmacy and informed of approval however, the technician states she could not pull the prescription to verify that PA went through or patient will lose his place in line to get the medication - she recommended we contact the patient and let him know to tell the pharmacy when they call that the medication is in that he does have a PA approval for the medication in place. I attempted a call to the patient to let him know but had to leave a voice mail message requesting a return call.

## 2022-09-18 NOTE — Telephone Encounter (Signed)
Per DPR - patient will accept message on cell # - called and left detailed message -

## 2022-09-29 ENCOUNTER — Other Ambulatory Visit: Payer: Self-pay | Admitting: Medical-Surgical

## 2022-10-04 DIAGNOSIS — R002 Palpitations: Secondary | ICD-10-CM

## 2022-10-13 ENCOUNTER — Other Ambulatory Visit (HOSPITAL_COMMUNITY): Payer: Medicare HMO

## 2022-10-23 DIAGNOSIS — R002 Palpitations: Secondary | ICD-10-CM | POA: Diagnosis not present

## 2022-11-03 ENCOUNTER — Other Ambulatory Visit: Payer: Self-pay | Admitting: Medical-Surgical

## 2022-11-03 DIAGNOSIS — I471 Supraventricular tachycardia, unspecified: Secondary | ICD-10-CM

## 2022-11-03 DIAGNOSIS — I440 Atrioventricular block, first degree: Secondary | ICD-10-CM

## 2022-11-03 DIAGNOSIS — R002 Palpitations: Secondary | ICD-10-CM

## 2022-11-04 ENCOUNTER — Ambulatory Visit (HOSPITAL_COMMUNITY): Payer: Medicare HMO | Attending: Cardiology

## 2022-11-04 DIAGNOSIS — E119 Type 2 diabetes mellitus without complications: Secondary | ICD-10-CM | POA: Diagnosis not present

## 2022-11-04 DIAGNOSIS — R011 Cardiac murmur, unspecified: Secondary | ICD-10-CM | POA: Diagnosis not present

## 2022-11-04 DIAGNOSIS — E785 Hyperlipidemia, unspecified: Secondary | ICD-10-CM | POA: Insufficient documentation

## 2022-11-04 DIAGNOSIS — R002 Palpitations: Secondary | ICD-10-CM

## 2022-11-04 LAB — ECHOCARDIOGRAM COMPLETE
Area-P 1/2: 2.8 cm2
S' Lateral: 2.8 cm

## 2022-11-09 ENCOUNTER — Telehealth: Payer: Self-pay | Admitting: Medical-Surgical

## 2022-11-09 NOTE — Telephone Encounter (Signed)
Contacted Danny Morgan. to schedule their annual wellness visit. Appointment made for 11/20/22 at [place] Patient Access [facility] II Direct Dial: [phone]

## 2022-11-11 ENCOUNTER — Telehealth: Payer: Self-pay | Admitting: Urology

## 2022-11-11 NOTE — Telephone Encounter (Signed)
Patient called and stated he is still waiting on his injectable prescription to be filled for his ED per his last office visit. He stated he needs this asap due to upcoming events! - lmr

## 2022-11-11 NOTE — Telephone Encounter (Signed)
Per [facility]- they did receive an Rx for Trimix on 09/16/22 and have not filled it yet as they were waiting on the pt to call and tell them they were ready for it to be filled. Pt notified that Rx is at pharmacy, they will need to call and tell them when they would like to pick it up. Pt expressed understanding.

## 2022-11-20 ENCOUNTER — Ambulatory Visit: Payer: Medicare HMO

## 2022-11-20 ENCOUNTER — Telehealth: Payer: Self-pay | Admitting: General Practice

## 2022-11-20 NOTE — Telephone Encounter (Signed)
Patient was scheduled for AWV at 4 pm on 11/20/22. Attempted to call patient x 3. Unable to get in touch with patient. Left message x 3 with the no show fee policy.

## 2022-12-01 ENCOUNTER — Other Ambulatory Visit: Payer: Self-pay | Admitting: Medical-Surgical

## 2022-12-08 ENCOUNTER — Telehealth: Payer: Self-pay | Admitting: General Practice

## 2022-12-08 ENCOUNTER — Ambulatory Visit (INDEPENDENT_AMBULATORY_CARE_PROVIDER_SITE_OTHER): Payer: Medicare HMO | Admitting: Medical-Surgical

## 2022-12-08 DIAGNOSIS — Z0000 Encounter for general adult medical examination without abnormal findings: Secondary | ICD-10-CM

## 2022-12-08 NOTE — Progress Notes (Signed)
MEDICARE ANNUAL WELLNESS VISIT  12/08/2022  Telephone Visit Disclaimer This Medicare AWV was conducted by telephone due to national recommendations for restrictions regarding the COVID-19 Pandemic (e.g. social distancing).  I verified, using two identifiers, that I am speaking with Danny Morgan. Danny Morgan. or their authorized healthcare agent. I discussed the limitations, risks, security, and privacy concerns of performing an evaluation and management service by telephone and the potential availability of an in-person appointment in the future. The patient expressed understanding and agreed to proceed.  Location of Patient: Home Location of Provider (nurse):  Provider home  Subjective:    Danny Morgan. Danny Morgan. is a 67 y.o. male patient of Samuel Bouche, NP who had a Medicare Annual Wellness Visit today via telephone. Aziel is Working full time and lives with their family. he has 1 child. he reports that he is socially active and does interact with friends/family regularly. 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  Patient Care Team: Samuel Bouche, NP as PCP - General (Nurse Practitioner) Michael Boston, MD as Consulting Physician (General Surgery) Darius Bump, [facility] (Pharmacist)     12/08/2022    4:08 PM 11/08/2017    7:37 AM  Advanced Directives  Does Patient Have a Medical Advance Directive? Yes Yes  Type of Advance Directive Living will [place]  Does patient want to make changes to medical advance directive? No - Patient declined     Hospital Utilization Over the Past 12 Months: # of hospitalizations or ER visits: 0 # of surgeries: 0  Review of Systems    Patient reports that his overall health is better compared to last year.  History obtained from chart review and the patient  Patient Reported Readings (BP, Pulse, CBG, Weight, etc) none  Pain Assessment Pain : No/denies pain     Current Medications & Allergies  (verified) Allergies as of 12/08/2022       Reactions   Penicillins Shortness Of Breath, Swelling   Cephalexin Diarrhea        Medication List        Accurate as of December 08, 2022  4:25 PM. If you have any questions, ask your nurse or doctor.          amitriptyline 25 MG tablet Commonly known as: ELAVIL TAKE 1 TABLET BY MOUTH EVERYDAY AT BEDTIME   atorvastatin 20 MG tablet Commonly known as: LIPITOR TAKE 1 TABLET BY MOUTH EVERY DAY   Biotin 5000 MCG Tabs Take 1 tablet by mouth daily.   BL GINKGO BILOBA PO Take 60 mg by mouth.   CENTRUM SILVER 50+MEN PO Take 1 tablet by mouth daily.   cetirizine 10 MG tablet Commonly known as: ZYRTEC TAKE 1 TABLET BY MOUTH EVERY DAY   CITRACAL +D3 PO Take 650 mg by mouth.   co-enzyme Q-10 30 MG capsule Take 100 mg by mouth 3 (three) times daily.   ipratropium 0.06 % nasal spray Commonly known as: ATROVENT Place 2 sprays into both nostrils 2 (two) times daily.   LORazepam 1 MG tablet Commonly known as: ATIVAN Take 1 tablet (1 mg total) by mouth daily as needed for anxiety.   Magnesium 200 MG Tabs Take 1 tablet by mouth at bedtime.   metFORMIN 1000 MG tablet Commonly known as: GLUCOPHAGE TAKE 1 TABLET BY MOUTH TWICE A DAY   omeprazole 10 MG capsule Commonly known as: PRILOSEC Take 10 mg by mouth daily.   Ozempic (1 MG/DOSE) 4 MG/3ML  Sopn Generic drug: Semaglutide (1 MG/DOSE) INJECT 1 MG ONCE A WEEK AS DIRECTED   QC TUMERIC COMPLEX PO Take 1,500 mg by mouth.   SUPER B COMPLEX/C PO Take 500 mg by mouth.   vitamin C 1000 MG tablet Take 1,000 mg by mouth daily.        History (reviewed): Past Medical History:  Diagnosis Date   Adjustment disorder with mixed anxiety and depressed mood 09/17/2020   Allergy    Angio-edema    Anxiety    Body mass index (BMI) 39.0-39.9, adult 09/17/2020   Cardiac murmur 06/24/2020   Cataract    beginning stage in rt. eye   Chest discomfort 06/24/2020   Colon polyps     Diabetes mellitus without complication    DOE (dyspnea on exertion) 06/24/2020   Eczema    Epistaxis 11/12/2020   Erectile dysfunction due to type 2 diabetes mellitus 11/12/2020   GERD (gastroesophageal reflux disease)    Hyperlipidemia    states cholesterol normal states being given to protect kidneys   Impingement syndrome, shoulder, left 08/28/2021   Insomnia 09/17/2020   Lumbar spinal stenosis 08/28/2021   Morbid obesity 06/24/2020   Neuropathy of both feet 09/17/2020   Palpitations 09/17/2020   Pilar cyst 09/17/2020   Stress due to family tension 09/17/2020   Tubular adenoma of colon 09/17/2020   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Past Surgical History:  Procedure Laterality Date   COLONOSCOPY W/ BIOPSIES AND POLYPECTOMY     RETINAL DETACHMENT SURGERY     age 47   WISDOM TOOTH EXTRACTION     Family History  Problem Relation Age of Onset   Parkinson's disease Father    Colon cancer Neg Hx    Stomach cancer Neg Hx    Pancreatic cancer Neg Hx    Esophageal cancer Neg Hx    Rectal cancer Neg Hx    Allergic rhinitis Neg Hx    Asthma Neg Hx    Eczema Neg Hx    Urticaria Neg Hx    Social History   Socioeconomic History   Marital status: Married    Spouse name: Luellen Pucker   Number of children: 1   Years of education: 12   Highest education level: 12th grade  Occupational History   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 He enjoys working and Engineer, petroleum.   Social Determinants of Health   Financial Resource Strain: Low Risk  (12/08/2022)    Overall Financial Resource Strain (CARDIA)    Difficulty of Paying Living Expenses: Not hard at all  Food Insecurity: No Food Insecurity (12/08/2022)   Hunger Vital Sign    Worried About Running Out of Food in the Last Year: Never true    Ran Out of Food in the Last Year: Never true  Transportation Needs: No Transportation Needs (12/08/2022)   PRAPARE - Hydrologist (Medical): No    Lack of Transportation (Non-Medical):  No  Physical Activity: Inactive (12/08/2022)   Exercise Vital Sign    Days of Exercise per Week: 0 days    Minutes of Exercise per Session: 0 min  Stress: No Stress Concern Present (12/08/2022)   [place]    Feeling of Stress : Not at all  Social Connections: Moderately Isolated (12/08/2022)   Social Connection and Isolation Panel [NHANES]    Frequency of Communication with Friends and Family: Once a week    Frequency of Social Gatherings with Friends and Family: More than three times a week    Attends Religious Services: Never    Marine scientist or Organizations: No    Attends Archivist Meetings: Never    Marital Status: Married    Activities of Daily Living    12/08/2022    4:16 PM  In your present state of health, do you have any difficulty performing the following activities:  Hearing? 0  Vision? 0  Difficulty concentrating or making decisions? 0  Walking or climbing stairs? 0  Dressing or bathing? 0  Doing errands, shopping? 0  Preparing Food and eating ? N  Using the Toilet? N  In the past six months, have you accidently leaked urine? N  Do you have problems with loss of bowel control? N  Managing your Medications? N  Managing your Finances? N  Housekeeping or managing your Housekeeping? N    Patient Education/ Literacy How often do you need to have someone help you when you read instructions, pamphlets, or other written materials from your  doctor or pharmacy?: 1 - Never What is the last grade level you completed in school?: GED  Exercise Current Exercise Habits: The patient has a physically strenuous job, but has no regular exercise apart from work., Exercise limited by: None identified  Diet Patient reports consuming 3 meals a day and 0 snack(s) a day Patient reports that his primary diet is: Regular Patient reports that she does have regular access to food.   Depression Screen    12/08/2022    4:08 PM 09/11/2022   11:37 AM 08/06/2021    2:39 PM 04/23/2021    4:34 PM 09/17/2020    2:54 PM 06/17/2020    9:45 AM  PHQ 2/9 Scores  PHQ - 2 Score 0 2 0 1 2 2   PHQ- 9 Score    6 10 6      Fall Risk    12/08/2022    4:08 PM 09/11/2022   11:37 AM 08/06/2021    2:34 PM 06/17/2020    9:45 AM  Fall Risk   Falls in the past year? 0 0 0 0  Number falls in past yr: 0 0 0 0  Injury with Fall? 0 0 0 0  Risk for fall due to : No Fall Risks No Fall Risks No Fall Risks   Follow up Falls evaluation completed Falls evaluation completed Falls evaluation completed Falls evaluation completed     Objective:  Keldrick Tiffany. Danny Morgan. seemed alert and oriented and he participated appropriately during our telephone visit.  Blood Pressure Weight BMI  BP Readings from Last 3 Encounters:  09/15/22 (!) 141/83  09/11/22 121/68  12/15/21 134/72   Wt Readings from Last 3 Encounters:  09/15/22 256 lb (116.1 kg)  09/11/22 261 lb 12.8 oz (118.8 kg)  12/15/21 258 lb 1.3 oz (117.1 kg)   BMI Readings from Last 1 Encounters:  09/15/22 36.73 kg/m    *  Unable to obtain current vital signs, weight, and BMI due to telephone visit type  Hearing/Vision  Witt did not seem to have difficulty with hearing/understanding during the telephone conversation Reports that he has not had a formal eye exam by an eye care professional within the past year Reports that he has had a formal hearing evaluation within the past year *Unable to fully assess hearing and  vision during telephone visit type  Cognitive Function:    12/08/2022    4:22 PM  6CIT Screen  What Year? 0 points  What month? 0 points  What time? 0 points  Count back from 20 0 points  Months in reverse 0 points  Repeat phrase 2 points  Total Score 2 points   (Normal:0-7, Significant for Dysfunction: >8)  Normal Cognitive Function Screening: Yes   Immunization & Health Maintenance Record Immunization History  Administered Date(s) Administered   PFIZER(Purple Top)SARS-COV-2 Vaccination 01/29/2020, 02/20/2020    Health Maintenance  Topic Date Due   Hepatitis C Screening  Never done   DTaP/Tdap/Td (1 - Tdap) Never done   Pneumonia Vaccine 6+ Years old (1 of 1 - PCV) Never done   OPHTHALMOLOGY EXAM  12/08/2022 (Originally 10/02/2021)   Diabetic kidney evaluation - Urine ACR  12/09/2022 (Originally 09/17/2022)   FOOT EXAM  12/10/2022 (Originally 09/17/2022)   HEMOGLOBIN A1C  03/12/2023   INFLUENZA VACCINE  04/08/2023   Diabetic kidney evaluation - eGFR measurement  09/12/2023   Medicare Annual Wellness (AWV)  12/08/2023   COLONOSCOPY (Pts 45-7yrs Insurance coverage will need to be confirmed)  08/12/2025   HPV VACCINES  Aged Out   COVID-19 Vaccine  Discontinued   Zoster Vaccines- Shingrix  Discontinued       Assessment  This is a routine wellness examination for Shiro D. Danny Morgan.Marland Kitchen  Health Maintenance: Due or Overdue Health Maintenance Due  Topic Date Due   Hepatitis C Screening  Never done   DTaP/Tdap/Td (1 - Tdap) Never done   Pneumonia Vaccine 46+ Years old (1 of 1 - PCV) Never done    Juanda Crumble D. Danny Morgan. does not need a referral for Community Assistance: Care Management:   no Social Work:    no Prescription Assistance:  no Nutrition/Diabetes Education:  no   Plan:  Personalized Goals  Goals Addressed               This Visit's Progress     Patient Stated (pt-stated)        Patient stated that he would like to continue to loose weight.         Personalized Health Maintenance & Screening Recommendations  Urine ACR Pneumonia vaccine Tetanus vaccine Foot exam Eye exam  Patient declined the vaccines at this time.  Lung Cancer Screening Recommended: no (Low Dose CT Chest recommended if Age 57-80 years, 30 pack-year currently smoking OR have quit w/in past 15 years) Hepatitis C Screening recommended: yes HIV Screening recommended: no  Advanced Directives: Written information was not prepared per patient's request.  Referrals & Orders No orders of the defined types were placed in this encounter.   Follow-up Plan Follow-up with Samuel Bouche, NP as planned Schedule your eye exam. Schedule shingles vaccine and tetanus vaccine at the pharmacy when you are ready. Foot exam and Urine ACR can be completed at the next in office visit with PCP. Medicare wellness visit in one year.  Patient will access AVS on my chart.   I have personally reviewed and noted the following  in the patient's chart:   Medical and social history Use of alcohol, tobacco or illicit drugs  Current medications and supplements Functional ability and status Nutritional status Physical activity Advanced directives List of other physicians Hospitalizations, surgeries, and ER visits in previous 12 months Vitals Screenings to include cognitive, depression, and falls Referrals and appointments  In addition, I have reviewed and discussed with Danny Morgan. Danny Morgan. certain preventive protocols, quality metrics, and best practice recommendations. A written personalized care plan for preventive services as well as general preventive health recommendations is available and can be mailed to the patient at his request.      Tinnie Gens, RN BSN  12/08/2022

## 2022-12-08 NOTE — Patient Instructions (Addendum)
[place] Maintenance Summary and Written Plan of Care  Danny Morgan ,  Thank you for allowing me to perform your Medicare Annual Wellness Visit and for your ongoing commitment to your health.   Health Maintenance & Immunization History Health Maintenance  Topic Date Due   DTaP/Tdap/Td (1 - Tdap) Never done   OPHTHALMOLOGY EXAM  12/08/2022 (Originally 10/02/2021)   Diabetic kidney evaluation - Urine ACR  12/09/2022 (Originally 09/17/2022)   FOOT EXAM  12/10/2022 (Originally 09/17/2022)   Pneumonia Vaccine 32+ Years old (1 of 1 - PCV) 12/08/2023 (Originally 05/29/2020)   Hepatitis C Screening  12/08/2023 (Originally 05/29/1974)   HEMOGLOBIN A1C  03/12/2023   INFLUENZA VACCINE  04/08/2023   Diabetic kidney evaluation - eGFR measurement  09/12/2023   Medicare Annual Wellness (AWV)  12/08/2023   COLONOSCOPY (Pts 45-44yrs Insurance coverage will need to be confirmed)  08/12/2025   HPV VACCINES  Aged Out   COVID-19 Vaccine  Discontinued   Zoster Vaccines- Shingrix  Discontinued   Immunization History  Administered Date(s) Administered   PFIZER(Purple Top)SARS-COV-2 Vaccination 01/29/2020, 02/20/2020    These are the patient goals that we discussed:  Goals Addressed               This Visit's Progress     Patient Stated (pt-stated)        Patient stated that he would like to continue to loose weight.          This is a list of Health Maintenance Items that are overdue or due now: Health Maintenance Due  Topic Date Due   DTaP/Tdap/Td (1 - Tdap) Never done   Urine ACR Pneumonia vaccine Tetanus vaccine Foot exam Eye exam  Patient declined the vaccines at this time.  Orders/Referrals Placed Today: No orders of the defined types were placed in this encounter.  (Contact our referral department at [phone] if you have not spoken with someone about your referral appointment within the next 5 days)    Follow-up Plan Follow-up with Samuel Bouche, NP as planned Schedule your eye exam. Schedule shingles vaccine and tetanus vaccine at the pharmacy when you are ready. Foot exam and Urine ACR can be completed at the next in office visit with PCP. Medicare wellness visit in one year.  Patient will access AVS on my chart.      Health Maintenance, Male Adopting a healthy lifestyle and getting preventive care are important in promoting health and wellness. Ask your health care provider about: The right schedule for you to have regular tests and exams. Things you can do on your own to prevent diseases and keep yourself healthy. What should I know about diet, weight, and exercise? Eat a healthy diet  Eat a diet that includes plenty of vegetables, fruits, low-fat dairy products, and lean protein. Do not eat a lot of foods that are high in solid fats, added sugars, or sodium. Maintain a healthy weight Body mass index (BMI) is a measurement that can be used to identify possible weight problems. It estimates body fat based on height and weight. Your health care provider can help determine your BMI and help you achieve or maintain a healthy weight. Get regular exercise Get regular exercise. This is one of the most important things you can do for your health. Most adults should: Exercise for at least 150 minutes each week. The exercise should increase your heart rate and make you sweat (moderate-intensity exercise). Do strengthening exercises at least twice a week.  This is in addition to the moderate-intensity exercise. Spend less time sitting. Even light physical activity can be beneficial. Watch cholesterol and blood lipids Have your blood tested for lipids and cholesterol at 68 years of age, then have this test every 5 years. You may need to have your cholesterol levels checked more often if: Your lipid or cholesterol levels are high. You are older than 68 years of age. You are at high risk for heart disease. What should I know about  cancer screening? Many types of cancers can be detected early and may often be prevented. Depending on your health history and family history, you may need to have cancer screening at various ages. This may include screening for: Colorectal cancer. Prostate cancer. Skin cancer. Lung cancer. What should I know about heart disease, diabetes, and high blood pressure? Blood pressure and heart disease High blood pressure causes heart disease and increases the risk of stroke. This is more likely to develop in people who have high blood pressure readings or are overweight. Talk with your health care provider about your target blood pressure readings. Have your blood pressure checked: Every 3-5 years if you are 43-26 years of age. Every year if you are 31 years old or older. If you are between the ages of 37 and 16 and are a current or former smoker, ask your health care provider if you should have a one-time screening for abdominal aortic aneurysm (AAA). Diabetes Have regular diabetes screenings. This checks your fasting blood sugar level. Have the screening done: Once every three years after age 52 if you are at a normal weight and have a low risk for diabetes. More often and at a younger age if you are overweight or have a high risk for diabetes. What should I know about preventing infection? Hepatitis B If you have a higher risk for hepatitis B, you should be screened for this virus. Talk with your health care provider to find out if you are at risk for hepatitis B infection. Hepatitis C Blood testing is recommended for: Everyone born from [birth year] through 1966. Anyone with known risk factors for hepatitis C. Sexually transmitted infections (STIs) You should be screened each year for STIs, including gonorrhea and chlamydia, if: You are sexually active and are younger than 68 years of age. You are older than 68 years of age and your health care provider tells you that you are at risk for this type  of infection. Your sexual activity has changed since you were last screened, and you are at increased risk for chlamydia or gonorrhea. Ask your health care provider if you are at risk. Ask your health care provider about whether you are at high risk for HIV. Your health care provider may recommend a prescription medicine to help prevent HIV infection. If you choose to take medicine to prevent HIV, you should first get tested for HIV. You should then be tested every 3 months for as long as you are taking the medicine. Follow these instructions at home: Alcohol use Do not drink alcohol if your health care provider tells you not to drink. If you drink alcohol: Limit how much you have to 0-2 drinks a day. Know how much alcohol is in your drink. In the [place] one drink equals one 12 oz bottle of beer (355 mL), one 5 oz glass of wine (148 mL), or one 1 oz glass of hard liquor (44 mL). Lifestyle Do not use any products that contain nicotine or tobacco. These  products include cigarettes, chewing tobacco, and vaping devices, such as e-cigarettes. If you need help quitting, ask your health care provider. Do not use street drugs. Do not share needles. Ask your health care provider for help if you need support or information about quitting drugs. General instructions Schedule regular health, dental, and eye exams. Stay current with your vaccines. Tell your health care provider if: You often feel depressed. You have ever been abused or do not feel safe at home. Summary Adopting a healthy lifestyle and getting preventive care are important in promoting health and wellness. Follow your health care provider's instructions about healthy diet, exercising, and getting tested or screened for diseases. Follow your health care provider's instructions on monitoring your cholesterol and blood pressure. This information is not intended to replace advice given to you by your health care provider. Make sure you discuss  any questions you have with your health care provider. Document Revised: 01/13/2021 Document Reviewed: 01/13/2021 Elsevier Patient Education  [place].

## 2022-12-08 NOTE — Telephone Encounter (Addendum)
Ozempic 1 mg weekly was sent to the [facility] on [address] on January 11 as a 85-month supply with 1 refill ordered.  An electronic confirmation of receipt was sent.  He should have medication available to last him through the middle of July.  Please advise him to contact his pharmacy regarding the refills and getting them ready for pickup.

## 2022-12-08 NOTE — Telephone Encounter (Signed)
Patient requested a refill of Ozempic 1 mg, during his medicare wellness visit, be sent to his pharmacy. He stated that he called his pharmacy but they asked him to call the doctor's office. He has been out of it for a while.

## 2022-12-09 NOTE — Telephone Encounter (Signed)
I called the pharmacy and they do have the prescription. They were out of stock. They will order more. I called and left a message for patient to call back.

## 2022-12-15 ENCOUNTER — Ambulatory Visit: Payer: Medicare HMO | Attending: Cardiology | Admitting: Cardiology

## 2022-12-15 ENCOUNTER — Ambulatory Visit: Payer: Medicare HMO | Admitting: Urology

## 2022-12-25 ENCOUNTER — Other Ambulatory Visit: Payer: Self-pay | Admitting: Pharmacist

## 2022-12-25 NOTE — Progress Notes (Signed)
Care Coordination  Outreached patient to discuss medication management, glucose control, and medication access. No answer, left voicemail to contact me at their convenience. Also sent mychart.  Lynnda Shields, PharmD, BCPS Clinical Pharmacist [facility]

## 2023-01-05 NOTE — Progress Notes (Deleted)
HPI: Follow-up coronary artery disease.  Previously seen by Dr. Tomie China but not since October 2021. Nuclear study November 2021 showed ejection fraction 67% and no ischemia or infarction.  Laboratories January 2024 showed TSH 1.78, hemoglobin 16.7, potassium 4.6, LDL 73 and magnesium 2.2.  Patient seen recently with palpitations primary care.  Monitor February 2024 showed sinus rhythm with occasional PVC, 4 beats nonsustained ventricular tachycardia, PACs and brief PAT.  Echocardiogram February 2024 showed normal LV function.  Cardiology now asked to follow-up.  Since last seen  Current Outpatient Medications  Medication Sig Dispense Refill   amitriptyline (ELAVIL) 25 MG tablet TAKE 1 TABLET BY MOUTH EVERYDAY AT BEDTIME 90 tablet 1   Ascorbic Acid (VITAMIN C) 1000 MG tablet Take 1,000 mg by mouth daily.     atorvastatin (LIPITOR) 20 MG tablet TAKE 1 TABLET BY MOUTH EVERY DAY 90 tablet 0   Biotin 5000 MCG TABS Take 1 tablet by mouth daily.     BL GINKGO BILOBA PO Take 60 mg by mouth.     Calcium-Phosphorus-Vitamin D (CITRACAL +D3 PO) Take 650 mg by mouth.     cetirizine (ZYRTEC) 10 MG tablet TAKE 1 TABLET BY MOUTH EVERY DAY 90 tablet 1   co-enzyme Q-10 30 MG capsule Take 100 mg by mouth 3 (three) times daily.     ipratropium (ATROVENT) 0.06 % nasal spray Place 2 sprays into both nostrils 2 (two) times daily. 15 mL 6   LORazepam (ATIVAN) 1 MG tablet Take 1 tablet (1 mg total) by mouth daily as needed for anxiety. 30 tablet 0   Magnesium 200 MG TABS Take 1 tablet by mouth at bedtime.     metFORMIN (GLUCOPHAGE) 1000 MG tablet TAKE 1 TABLET BY MOUTH TWICE A DAY 180 tablet 1   Multiple Vitamins-Minerals (CENTRUM SILVER 50+MEN PO) Take 1 tablet by mouth daily.     omeprazole (PRILOSEC) 10 MG capsule Take 10 mg by mouth daily.      Semaglutide, 1 MG/DOSE, (OZEMPIC, 1 MG/DOSE,) 4 MG/3ML SOPN INJECT 1 MG ONCE A WEEK AS DIRECTED 9 mL 1   SUPER B COMPLEX/C PO Take 500 mg by mouth.     Turmeric (QC  TUMERIC COMPLEX PO) Take 1,500 mg by mouth.     No current facility-administered medications for this visit.     Past Medical History:  Diagnosis Date   Adjustment disorder with mixed anxiety and depressed mood 09/17/2020   Allergy    Angio-edema    Anxiety    Body mass index (BMI) 39.0-39.9, adult 09/17/2020   Cardiac murmur 06/24/2020   Cataract    beginning stage in rt. eye   Chest discomfort 06/24/2020   Colon polyps    Diabetes mellitus without complication (HCC)    DOE (dyspnea on exertion) 06/24/2020   Eczema    Epistaxis 11/12/2020   Erectile dysfunction due to type 2 diabetes mellitus (HCC) 11/12/2020   GERD (gastroesophageal reflux disease)    Hyperlipidemia    states cholesterol normal states being given to protect kidneys   Impingement syndrome, shoulder, left 08/28/2021   Insomnia 09/17/2020   Lumbar spinal stenosis 08/28/2021   Morbid obesity (HCC) 06/24/2020   Neuropathy of both feet 09/17/2020   Palpitations 09/17/2020   Pilar cyst 09/17/2020   Stress due to family tension 09/17/2020   Tubular adenoma of colon 09/17/2020   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[facility])     Past Surgical History:  Procedure Laterality Date  COLONOSCOPY W/ BIOPSIES AND POLYPECTOMY     RETINAL DETACHMENT SURGERY     age 22   WISDOM TOOTH EXTRACTION      Social History   Socioeconomic History   Marital status: Married    Spouse name: Magda Paganini   Number of children: 1   Years of education: 12   Highest education level: 12th grade  Occupational History   Occupation: Pharmacist, community    Comment: Full time  Tobacco Use   Smoking status: Former    Types: Cigarettes   Smokeless tobacco: Never   Tobacco comments:    quit at age 51  Vaping Use   Vaping Use: Never used  Substance and Sexual Activity   Alcohol use: Yes    Comment: Occasionally   Drug use: No   Sexual activity: Not Currently  Other Topics Concern   Not on file  Social History Narrative    Lives with his family, (son and 2 grandchildren). 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   Social Determinants of Health   Financial Resource Strain: Low Risk  (12/08/2022)   Overall Financial Resource Strain (CARDIA)    Difficulty of Paying Living Expenses: Not hard at all  Food Insecurity: No Food Insecurity (12/08/2022)   Hunger Vital Sign    Worried About Running Out of Food in the Last Year: Never true    Ran Out of Food in the Last Year: Never true  Transportation Needs: No Transportation Needs (12/08/2022)   PRAPARE - Administrator, Civil Service (Medical): No    Lack of Transportation (Non-Medical): No  Physical Activity: Inactive (12/08/2022)   Exercise Vital Sign    Days of Exercise per Week: 0 days    Minutes of Exercise per Session: 0 min  Stress: No Stress Concern Present (12/08/2022)   [employer] of Occupational Health - Occupational Stress Questionnaire    Feeling of Stress : Not at all  Social Connections: Moderately Isolated (12/08/2022)   Social Connection and Isolation Panel [NHANES]    Frequency of Communication with Friends and Family: Once a week    Frequency of Social Gatherings with Friends and Family: More than three times a week    Attends Religious Services: Never    Database administrator or Organizations: No    Attends Banker Meetings: Never    Marital Status: Married  Catering manager Violence: Not At Risk (12/08/2022)   Humiliation, Afraid, Rape, and Kick questionnaire    Fear of Current or Ex-Partner: No    Emotionally Abused: No    Physically Abused: No    Sexually Abused: No    Family History  Problem Relation Age of Onset   Parkinson's disease Father    Colon cancer Neg Hx    Stomach cancer Neg Hx    Pancreatic cancer Neg Hx    Esophageal cancer Neg Hx    Rectal cancer Neg Hx    Allergic rhinitis Neg Hx    Asthma Neg Hx    Eczema Neg Hx    Urticaria Neg Hx     ROS: no fevers or chills, productive cough, hemoptysis,  dysphasia, odynophagia, melena, hematochezia, dysuria, hematuria, rash, seizure activity, orthopnea, PND, pedal edema, claudication. Remaining systems are negative.  Physical Exam: Well-developed well-nourished in no acute distress.  Skin is warm and dry.  HEENT is normal.  Neck is supple.  Chest is clear to auscultation with normal expansion.  Cardiovascular exam is regular rate and rhythm.  Abdominal  exam nontender or distended. No masses palpated. Extremities show no edema. neuro grossly intact  ECG- personally reviewed  A/P  1 palpitations-recent echocardiogram showed normal LV function.  Monitor showed sinus rhythm with PVCs, brief nonsustained VT, PACs and brief PAT.  2 hyperlipidemia-  3 hypertension-  Olga Millers, MD

## 2023-01-11 ENCOUNTER — Ambulatory Visit: Payer: Medicare HMO | Admitting: Cardiology

## 2023-03-24 NOTE — Progress Notes (Deleted)
HPI: Follow-up palpitations, hypertension and hyperlipidemia.  Previously followed by Dr. Tomie China but transitioning to me.  Nuclear study November 2021 showed ejection fraction 67% and normal perfusion.  Monitor February 2024 showed sinus rhythm with rare PVCs, PACs and short runs of SVT.  Echocardiogram February 2024 showed normal LV function.  Since last seen  Current Outpatient Medications  Medication Sig Dispense Refill   amitriptyline (ELAVIL) 25 MG tablet TAKE 1 TABLET BY MOUTH EVERYDAY AT BEDTIME 90 tablet 1   Ascorbic Acid (VITAMIN C) 1000 MG tablet Take 1,000 mg by mouth daily.     atorvastatin (LIPITOR) 20 MG tablet TAKE 1 TABLET BY MOUTH EVERY DAY 90 tablet 0   Biotin 5000 MCG TABS Take 1 tablet by mouth daily.     BL GINKGO BILOBA PO Take 60 mg by mouth.     Calcium-Phosphorus-Vitamin D (CITRACAL +D3 PO) Take 650 mg by mouth.     cetirizine (ZYRTEC) 10 MG tablet TAKE 1 TABLET BY MOUTH EVERY DAY 90 tablet 1   co-enzyme Q-10 30 MG capsule Take 100 mg by mouth 3 (three) times daily.     ipratropium (ATROVENT) 0.06 % nasal spray Place 2 sprays into both nostrils 2 (two) times daily. 15 mL 6   LORazepam (ATIVAN) 1 MG tablet Take 1 tablet (1 mg total) by mouth daily as needed for anxiety. 30 tablet 0   Magnesium 200 MG TABS Take 1 tablet by mouth at bedtime.     metFORMIN (GLUCOPHAGE) 1000 MG tablet TAKE 1 TABLET BY MOUTH TWICE A DAY 180 tablet 1   Multiple Vitamins-Minerals (CENTRUM SILVER 50+MEN PO) Take 1 tablet by mouth daily.     omeprazole (PRILOSEC) 10 MG capsule Take 10 mg by mouth daily.      Semaglutide, 1 MG/DOSE, (OZEMPIC, 1 MG/DOSE,) 4 MG/3ML SOPN INJECT 1 MG ONCE A WEEK AS DIRECTED 9 mL 1   SUPER B COMPLEX/C PO Take 500 mg by mouth.     Turmeric (QC TUMERIC COMPLEX PO) Take 1,500 mg by mouth.     No current facility-administered medications for this visit.     Past Medical History:  Diagnosis Date   Adjustment disorder with mixed anxiety and depressed mood  09/17/2020   Allergy    Angio-edema    Anxiety    Body mass index (BMI) 39.0-39.9, adult 09/17/2020   Cardiac murmur 06/24/2020   Cataract    beginning stage in rt. eye   Chest discomfort 06/24/2020   Colon polyps    Diabetes mellitus without complication (HCC)    DOE (dyspnea on exertion) 06/24/2020   Eczema    Epistaxis 11/12/2020   Erectile dysfunction due to type 2 diabetes mellitus (HCC) 11/12/2020   GERD (gastroesophageal reflux disease)    Hyperlipidemia    states cholesterol normal states being given to protect kidneys   Impingement syndrome, shoulder, left 08/28/2021   Insomnia 09/17/2020   Lumbar spinal stenosis 08/28/2021   Morbid obesity (HCC) 06/24/2020   Neuropathy of both feet 09/17/2020   Palpitations 09/17/2020   Pilar cyst 09/17/2020   Stress due to family tension 09/17/2020   Tubular adenoma of colon 09/17/2020   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[facility])     Past Surgical History:  Procedure Laterality Date   COLONOSCOPY W/ BIOPSIES AND POLYPECTOMY     RETINAL DETACHMENT SURGERY     age 55   WISDOM TOOTH EXTRACTION      Social History   Socioeconomic History  Marital status: Married    Spouse name: Magda Paganini   Number of children: 1   Years of education: 12   Highest education level: 12th grade  Occupational History   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, dispensing    Comment: Full time  Tobacco Use   Smoking status: Former    Types: Cigarettes   Smokeless tobacco: Never   Tobacco comments:    quit at age 12  Vaping Use   Vaping status: Never Used  Substance and Sexual Activity   Alcohol use: Yes    Comment: Occasionally   Drug use: No   Sexual activity: Not Currently  Other Topics Concern   Not on file  Social History Narrative   Lives with his family, (son and 2 grandchildren). 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   Social Determinants of Health   Financial Resource Strain: Low Risk  (12/08/2022)   Overall Financial Resource Strain (CARDIA)     Difficulty of Paying Living Expenses: Not hard at all  Food Insecurity: No Food Insecurity (12/08/2022)   Hunger Vital Sign    Worried About Running Out of Food in the Last Year: Never true    Ran Out of Food in the Last Year: Never true  Transportation Needs: No Transportation Needs (12/08/2022)   PRAPARE - Administrator, Civil Service (Medical): No    Lack of Transportation (Non-Medical): No  Physical Activity: Inactive (12/08/2022)   Exercise Vital Sign    Days of Exercise per Week: 0 days    Minutes of Exercise per Session: 0 min  Stress: No Stress Concern Present (12/08/2022)   [employer] of Occupational Health - Occupational Stress Questionnaire    Feeling of Stress : Not at all  Social Connections: Moderately Isolated (12/08/2022)   Social Connection and Isolation Panel [NHANES]    Frequency of Communication with Friends and Family: Once a week    Frequency of Social Gatherings with Friends and Family: More than three times a week    Attends Religious Services: Never    Database administrator or Organizations: No    Attends Banker Meetings: Never    Marital Status: Married  Catering manager Violence: Not At Risk (12/08/2022)   Humiliation, Afraid, Rape, and Kick questionnaire    Fear of Current or Ex-Partner: No    Emotionally Abused: No    Physically Abused: No    Sexually Abused: No    Family History  Problem Relation Age of Onset   Parkinson's disease Father    Colon cancer Neg Hx    Stomach cancer Neg Hx    Pancreatic cancer Neg Hx    Esophageal cancer Neg Hx    Rectal cancer Neg Hx    Allergic rhinitis Neg Hx    Asthma Neg Hx    Eczema Neg Hx    Urticaria Neg Hx     ROS: no fevers or chills, productive cough, hemoptysis, dysphasia, odynophagia, melena, hematochezia, dysuria, hematuria, rash, seizure activity, orthopnea, PND, pedal edema, claudication. Remaining systems are negative.  Physical Exam: Well-developed well-nourished in  no acute distress.  Skin is warm and dry.  HEENT is normal.  Neck is supple.  Chest is clear to auscultation with normal expansion.  Cardiovascular exam is regular rate and rhythm.  Abdominal exam nontender or distended. No masses palpated. Extremities show no edema. neuro grossly intact  ECG- personally reviewed  A/P  1 hypertension-  2 hyperlipidemia-  3 palpitations-  Olga Millers, MD

## 2023-03-31 ENCOUNTER — Ambulatory Visit: Payer: Medicare HMO | Admitting: Cardiology

## 2023-04-22 ENCOUNTER — Other Ambulatory Visit: Payer: Self-pay | Admitting: Medical-Surgical

## 2023-04-23 ENCOUNTER — Telehealth: Payer: Self-pay | Admitting: Medical-Surgical

## 2023-04-23 NOTE — Telephone Encounter (Signed)
LVM for patient to call to make an appointment

## 2023-05-25 NOTE — Progress Notes (Signed)
Referring-Joy Larinda Buttery, NP Reason for referral-palpitations  HPI: 67 year old male for evaluation of palpitations at request of Christen Butter, NP.  Previously followed by Dr. Tomie China but transitioning to me.  Last seen April 2023.  Laboratories January 2024 showed TSH 1.78, hemoglobin 16.7, creatinine 1.03, potassium 4.6. Nuclear study November 2021 showed ejection fraction 67% and no ischemia or infarction.  Monitor February 2024 showed sinus rhythm with rare PVC, 4 beats nonsustained ventricular tachycardia, frequent PACs and short runs of SVT longest lasting 19 beats. Echocardiogram February 2024 showed normal LV function.  Cardiology now asked to evaluate.  Patient denies dyspnea on exertion, orthopnea, PND, pedal edema, exertional chest pain or syncope.  He does occasionally feel his heart flutter.  Current Outpatient Medications  Medication Sig Dispense Refill   amitriptyline (ELAVIL) 25 MG tablet TAKE 1 TABLET BY MOUTH EVERYDAY AT BEDTIME 90 tablet 1   Ascorbic Acid (VITAMIN C) 1000 MG tablet Take 1,000 mg by mouth daily.     atorvastatin (LIPITOR) 20 MG tablet TAKE 1 TABLET BY MOUTH EVERY DAY 90 tablet 0   Biotin 5000 MCG TABS Take 1 tablet by mouth daily.     BL GINKGO BILOBA PO Take 60 mg by mouth.     Calcium-Phosphorus-Vitamin D (CITRACAL +D3 PO) Take 650 mg by mouth.     cetirizine (ZYRTEC) 10 MG tablet Take 1 tablet (10 mg total) by mouth daily. NEEDS APPOINTMENT FOR FURTHER REFILLS. 90 tablet 0   co-enzyme Q-10 30 MG capsule Take 100 mg by mouth 3 (three) times daily.     ipratropium (ATROVENT) 0.06 % nasal spray Place 2 sprays into both nostrils 2 (two) times daily. 15 mL 6   LORazepam (ATIVAN) 1 MG tablet Take 1 tablet (1 mg total) by mouth daily as needed for anxiety. 30 tablet 0   Magnesium 200 MG TABS Take 1 tablet by mouth at bedtime.     Multiple Vitamins-Minerals (CENTRUM SILVER 50+MEN PO) Take 1 tablet by mouth daily.     omeprazole (PRILOSEC) 10 MG capsule Take 10 mg  by mouth daily.      SUPER B COMPLEX/C PO Take 500 mg by mouth.     metFORMIN (GLUCOPHAGE) 1000 MG tablet TAKE 1 TABLET BY MOUTH TWICE A DAY (Patient not taking: Reported on 06/07/2023) 180 tablet 1   Semaglutide, 1 MG/DOSE, (OZEMPIC, 1 MG/DOSE,) 4 MG/3ML SOPN INJECT 1 MG ONCE A WEEK AS DIRECTED (Patient not taking: Reported on 06/07/2023) 9 mL 1   Turmeric (QC TUMERIC COMPLEX PO) Take 1,500 mg by mouth. (Patient not taking: Reported on 06/07/2023)     No current facility-administered medications for this visit.    Allergies  Allergen Reactions   Penicillins Shortness Of Breath and Swelling   Cephalexin Diarrhea     Past Medical History:  Diagnosis Date   Adjustment disorder with mixed anxiety and depressed mood 09/17/2020   Allergy    Angio-edema    Anxiety    Body mass index (BMI) 39.0-39.9, adult 09/17/2020   Cardiac murmur 06/24/2020   Cataract    beginning stage in rt. 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   Palpitations 09/17/2020   Pilar cyst 09/17/2020   Stress due to family tension 09/17/2020   Tubular adenoma of colon 09/17/2020   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(HCC)     Past Surgical History:  Procedure Laterality Date   COLONOSCOPY W/ BIOPSIES AND POLYPECTOMY     RETINAL DETACHMENT SURGERY     age 20   WISDOM TOOTH EXTRACTION      Social History   Socioeconomic History   Marital status: Married    Spouse name: Magda Paganini   Number of  children: 1   Years of education: 12   Highest education level: 12th grade  Occupational History   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, dispensing    Comment: Full time  Tobacco Use   Smoking status: Former    Types: Cigarettes   Smokeless tobacco: Never   Tobacco comments:    quit at age 52  Vaping Use   Vaping status: Never Used  Substance and Sexual Activity   Alcohol use: Yes    Comment: Occasionally   Drug use: No   Sexual activity: Not Currently  Other Topics Concern   Not on file  Social History Narrative   Lives with his family, (son and 2 grandchildren). 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   Social Determinants of Health   Financial Resource Strain: Low Risk  (12/08/2022)   Overall Financial Resource Strain (CARDIA)    Difficulty of Paying Living Expenses: Not hard at all  Food Insecurity: No Food Insecurity (12/08/2022)   Hunger Vital Sign    Worried About Running Out of Food in the Last Year: Never true    Ran Out of Food in the Last Year: Never true  Transportation Needs: No Transportation Needs (12/08/2022)   PRAPARE - Administrator, Civil Service (Medical): No    Lack of Transportation (Non-Medical): No  Physical Activity: Inactive (12/08/2022)   Exercise Vital Sign    Days of Exercise per Week: 0 days    Minutes of Exercise per Session: 0 min  Stress: No Stress Concern Present (12/08/2022)   [employer] of Occupational Health - Occupational Stress Questionnaire    Feeling of Stress : Not at all  Social Connections: Unknown (03/19/2023)   Received from [facility]   Social Network    Social Network: Not on file  Intimate Partner Violence: Unknown (03/19/2023)   Received from [facility]   HITS    Physically Hurt: Not on file    Insult or Talk Down To: Not on file    Threaten Physical Harm: Not on file    Scream or Curse: Not on file    Family History  Problem Relation Age of Onset   Parkinson's disease Father    Colon cancer Neg Hx    Stomach cancer  Neg Hx    Pancreatic cancer Neg Hx    Esophageal cancer Neg Hx    Rectal cancer Neg Hx    Allergic rhinitis Neg Hx    Asthma Neg Hx    Eczema Neg Hx    Urticaria Neg Hx     ROS: no fevers or chills, productive cough, hemoptysis, dysphasia, odynophagia, melena, hematochezia, dysuria, hematuria, rash, seizure activity, orthopnea, PND, pedal edema, claudication. Remaining systems are negative.  Physical Exam:   Blood pressure (!) 142/82, pulse 95,  height 5\' 10"  (1.778 m), weight 260 lb (117.9 kg), SpO2 96%.  General:  Well developed/well nourished in NAD Skin warm/dry Patient not depressed No peripheral clubbing Back-normal HEENT-normal/normal eyelids Neck supple/normal carotid upstroke bilaterally; no bruits; no JVD; no thyromegaly chest - CTA/ normal expansion CV - RRR/normal S1 and S2; no murmurs, rubs or gallops;  PMI nondisplaced Abdomen -NT/ND, no HSM, no mass, + bowel sounds, postive bruit 2+ femoral pulses, no bruits Ext-no edema, chords, 2+ DP Neuro-grossly nonfocal  EKG Interpretation Date/Time:  Monday June 07 2023 14:15:51 EDT Ventricular Rate:  97 PR Interval:  206 QRS Duration:  98 QT Interval:  350 QTC Calculation: 444 R Axis:   -89  Text Interpretation: Normal sinus rhythm Left axis deviation Low voltage QRS Possible Anterolateral infarct (cited on or before Confirmed by Olga Millers ([id number]) on 06/07/2023 2:27:28 PM    A/P  1 palpitations-previous echocardiogram showed normal LV function.  Monitor showed sinus rhythm with occasional PVC, 4 beats nonsustained ventricular tachycardia, PACs and brief SVT.  Symptoms sound likely to be PVCs.  We discussed addition of Toprol but he would like to avoid medications at this point.  If his symptoms worsen we will consider in the future.  2 bruit-schedule abdominal ultrasound to exclude aneurysm.  3 hyperlipidemia-given history of diabetes mellitus will increase Lipitor to 40 mg daily.  Check lipids and liver  in 8 weeks.  Goal LDL less than 55.  Check calcium score.  4 obesity-patient is working on weight loss with diet and exercise.  Olga Millers, MD

## 2023-05-30 ENCOUNTER — Other Ambulatory Visit: Payer: Self-pay | Admitting: Medical-Surgical

## 2023-06-07 ENCOUNTER — Ambulatory Visit: Payer: Medicare HMO | Admitting: Cardiology

## 2023-06-07 ENCOUNTER — Encounter: Payer: Self-pay | Admitting: Cardiology

## 2023-06-07 VITALS — BP 142/82 | HR 95 | Ht 70.0 in | Wt 260.0 lb

## 2023-06-07 DIAGNOSIS — Z09 Encounter for follow-up examination after completed treatment for conditions other than malignant neoplasm: Secondary | ICD-10-CM

## 2023-06-07 DIAGNOSIS — R0989 Other specified symptoms and signs involving the circulatory and respiratory systems: Secondary | ICD-10-CM

## 2023-06-07 DIAGNOSIS — E782 Mixed hyperlipidemia: Secondary | ICD-10-CM

## 2023-06-07 DIAGNOSIS — R002 Palpitations: Secondary | ICD-10-CM | POA: Diagnosis not present

## 2023-06-07 MED ORDER — ATORVASTATIN CALCIUM 40 MG PO TABS: 40.0000 mg | ORAL_TABLET | Freq: Every day | ORAL | 3 refills | Status: DC

## 2023-06-07 NOTE — Patient Instructions (Signed)
Medication Instructions:   INCREASE ATORVASTATIN TO 40 MG ONCE DAILY  *If you need a refill on your cardiac medications before your next appointment, please call your pharmacy*   Lab Work:  Your physician recommends that you return for lab work in: 8 [facility]  If you have labs (blood work) drawn today and your tests are completely normal, you will receive your results only by: MyChart Message (if you have MyChart) OR A paper copy in the mail If you have any lab test that is abnormal or we need to change your treatment, we will call you to review the results.   Testing/Procedures:  Your physician has requested that you have an abdominal aorta duplex. During this test, an ultrasound is used to evaluate the aorta. Allow 30 minutes for this exam. Do not eat after midnight the day before and avoid carbonated beverages [place] OFFICE  CORONARY CALCIUM SCORING CT SCAN AT THE [place] IMAGING DEPARTMENT   Follow-Up: At [facility], you and your health needs are our priority.  As part of our continuing mission to provide you with exceptional heart care, we have created designated Provider Care Teams.  These Care Teams include your primary Cardiologist (physician) and Advanced Practice Providers (APPs -  Physician Assistants and Nurse Practitioners) who all work together to provide you with the care you need, when you need it.  We recommend signing up for the patient portal called "MyChart".  Sign up information is provided on this After Visit Summary.  MyChart is used to connect with patients for Virtual Visits (Telemedicine).  Patients are able to view lab/test results, encounter notes, upcoming appointments, etc.  Non-urgent messages can be sent to your provider as well.   To learn more about what you can do with MyChart, go to [email or web address].    Your next appointment:   6 month(s)  Provider:   Olga Millers, MD

## 2023-06-17 ENCOUNTER — Ambulatory Visit: Payer: Medicare HMO

## 2023-06-17 ENCOUNTER — Ambulatory Visit (INDEPENDENT_AMBULATORY_CARE_PROVIDER_SITE_OTHER): Payer: Self-pay

## 2023-06-17 DIAGNOSIS — I714 Abdominal aortic aneurysm, without rupture, unspecified: Secondary | ICD-10-CM

## 2023-06-17 DIAGNOSIS — E782 Mixed hyperlipidemia: Secondary | ICD-10-CM

## 2023-06-17 DIAGNOSIS — R0989 Other specified symptoms and signs involving the circulatory and respiratory systems: Secondary | ICD-10-CM | POA: Diagnosis not present

## 2023-06-28 ENCOUNTER — Encounter: Payer: Self-pay | Admitting: *Deleted

## 2023-09-01 ENCOUNTER — Other Ambulatory Visit: Payer: Self-pay | Admitting: Medical-Surgical

## 2023-09-17 ENCOUNTER — Other Ambulatory Visit: Payer: Self-pay | Admitting: Medical-Surgical

## 2023-09-18 ENCOUNTER — Other Ambulatory Visit: Payer: Self-pay | Admitting: Medical-Surgical

## 2023-10-11 ENCOUNTER — Ambulatory Visit (INDEPENDENT_AMBULATORY_CARE_PROVIDER_SITE_OTHER): Payer: Medicare HMO | Admitting: Medical-Surgical

## 2023-10-11 ENCOUNTER — Encounter: Payer: Self-pay | Admitting: Medical-Surgical

## 2023-10-11 VITALS — BP 124/68 | HR 80 | Resp 20 | Ht 70.0 in | Wt 257.0 lb

## 2023-10-11 DIAGNOSIS — E782 Mixed hyperlipidemia: Secondary | ICD-10-CM | POA: Diagnosis not present

## 2023-10-11 DIAGNOSIS — F419 Anxiety disorder, unspecified: Secondary | ICD-10-CM | POA: Diagnosis not present

## 2023-10-11 DIAGNOSIS — Z7985 Long-term (current) use of injectable non-insulin antidiabetic drugs: Secondary | ICD-10-CM

## 2023-10-11 DIAGNOSIS — Z125 Encounter for screening for malignant neoplasm of prostate: Secondary | ICD-10-CM | POA: Insufficient documentation

## 2023-10-11 DIAGNOSIS — R8290 Unspecified abnormal findings in urine: Secondary | ICD-10-CM | POA: Insufficient documentation

## 2023-10-11 DIAGNOSIS — F5104 Psychophysiologic insomnia: Secondary | ICD-10-CM

## 2023-10-11 DIAGNOSIS — E1149 Type 2 diabetes mellitus with other diabetic neurological complication: Secondary | ICD-10-CM

## 2023-10-11 LAB — POCT URINALYSIS DIP (CLINITEK)
Bilirubin, UA: NEGATIVE
Blood, UA: NEGATIVE
Glucose, UA: >=1000 mg/dL — AB
Ketones, POC UA: NEGATIVE mg/dL
Leukocytes, UA: NEGATIVE
Nitrite, UA: NEGATIVE
POC PROTEIN,UA: 30 — AB
Spec Grav, UA: 1.015 (ref 1.010–1.025)
Urobilinogen, UA: 0.2 U/dL
pH, UA: 8.5 — AB (ref 5.0–8.0)

## 2023-10-11 LAB — POCT GLYCOSYLATED HEMOGLOBIN (HGB A1C)
HbA1c, POC (controlled diabetic range): 10.2 % — AB (ref 0.0–7.0)
Hemoglobin A1C: 10.2 % — AB (ref 4.0–5.6)

## 2023-10-11 LAB — POCT UA - MICROALBUMIN
Creatinine, POC: 100 mg/dL
Microalbumin Ur, POC: 80 mg/L

## 2023-10-11 MED ORDER — TEMAZEPAM 15 MG PO CAPS: 15.0000 mg | ORAL_CAPSULE | Freq: Every evening | ORAL | 1 refills | Status: DC | PRN

## 2023-10-11 MED ORDER — TIRZEPATIDE 2.5 MG/0.5ML ~~LOC~~ SOAJ: 2.5000 mg | SUBCUTANEOUS | 0 refills | Status: DC

## 2023-10-11 NOTE — Progress Notes (Signed)
 Medical screening examination/treatment was performed by qualified clinical staff member and as supervising provider I was immediately available for consultation/collaboration. I have reviewed documentation and agree with assessment and plan.  Thayer Ohm, DNP, APRN, FNP-BC [facility]

## 2023-10-11 NOTE — Progress Notes (Signed)
Subjective:  Patient ID: Danny Morgan. Danny Hoof., male    DOB: 1956/02/19, 68 y.o.   MRN: [id number]  Patient Care Team: Christen Butter, NP as PCP - General (Nurse Practitioner) Karie Soda, MD as Consulting Physician (General Surgery) Gabriel Carina, [facility] (Inactive) (Pharmacist)   Chief Complaint:  Diabetes   HPI:  Danny Morgan. Danny Morgan. is a 68 y.o. male presenting on 10/11/2023 for Diabetes   History, Exam,  Impression and Plan HPI  1. 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(HCC) (Primary) Pt off of diabetes medication since October 2024 because of cost and concern bad reputation of medicine. Pt unsure if he wants to restart Metformin because of negative reports of it causing harm. Pt interested in herbal alternatives and was recommended berberine and will take per package directions. Pt stopped taking Semaglutide because of donut hole and out of pocket cost was prohibitive. Pt interested in restarting Semaglutide or tirzepatide.  A1c this visit was 10.2 and had urine >1000 glucose. 9/10 touches detected on bilateral diabetic monofilament foot exam was unable to feel on bilateral 5th toe. Diabetes is poorly controlled and is off of diabetic medication regimen. Start tirzepatide pending approval. - POCT HgB A1C today - POCT UA - Microalbumin today - HM Diabetes Foot Exam today - Ambulatory referral to Ophthalmology - CMP14+EGFR - Lipid panel - tirzepatide (MOUNJARO) 2.5 MG/0.5ML Pen; Inject 2.5 mg into the skin once a week.  Dispense: 2 mL; Refill: 0  2. Bad odor of urine Elevated microalbumin urine elevated and elevated glucose in urine. Culture sent. Will notify with results.  - POCT URINALYSIS DIP (CLINITEK) - Urine Culture - CBC with Differential/Platelet - CMP14+EGFR  3. Mixed hyperlipidemia Pt not on specific diet and is taking atorvastatin 40 mg daily PO as directed. 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 Back injury last year has limited physical activity secondary to pain. Doesn't exercise outside of work. Continue astorvastatin 40 mg daily PO.  - CMP14+EGFR - Lipid panel  4. Psychophysiological insomnia Difficulty getting to, and staying asleep secondary to anxiety and back pain. Has been using old prescribed lorazepam to help with sleep and states that it helps. Start temazepam and stop taking lorazepam. - Start temazepam (RESTORIL) 15 MG capsule; Take 1-2 capsules (15-30 mg total) by mouth at bedtime as needed for sleep.  Dispense: 60 capsule; Refill: 1  5. Prostate cancer screening Pt concerned about cancer and a friend advised getting "C125" lab test. Pt denies any active symptoms or pain. Per USPSTF guidelines will screen for prostate cancer. Pt to determine cost of C125 lab and decide if he wants to pay out of pocket for it. - PSA Total (Reflex To Free)  6. Anxiety Reports anxiety/insomnia. Has been taking amitriptyline 25 mg nightly, tolerating well without side effects. Not interested in changing amitriptyline and feels medication is helping with his mood management. Reports stressors involving his injured back, his health, his son, spouse, and grandchild. He does still endorse significant situational anxiety and depression. Continue amitriptyline 25 mg PO HS.   Continue all other maintenance medications.  Follow up plan: Return in about 3 months (around 01/08/2024) for DM follow up. Discussed that when donut hole hits in 2025 and is unable to afford diabetes medications changing to insulin may be needed.  Relevant past medical, surgical, family, and social history reviewed and updated as indicated.  Allergies and medications  reviewed and updated. Data reviewed: Chart in Epic.   Past Medical History:  Diagnosis Date   Adjustment disorder with mixed anxiety and depressed mood 09/17/2020   Allergy    Angio-edema    Anxiety    Body mass index (BMI)  39.0-39.9, adult 09/17/2020   Cardiac murmur 06/24/2020   Cataract    beginning stage in rt. eye   Chest discomfort 06/24/2020   Colon polyps    Diabetes mellitus without complication (HCC)    DOE (dyspnea on exertion) 06/24/2020   Eczema    Epistaxis 11/12/2020   Erectile dysfunction due to type 2 diabetes mellitus (HCC) 11/12/2020   GERD (gastroesophageal reflux disease)    Hyperlipidemia    states cholesterol normal states being given to protect kidneys   Impingement syndrome, shoulder, left 08/28/2021   Insomnia 09/17/2020   Lumbar spinal stenosis 08/28/2021   Morbid obesity (HCC) 06/24/2020   Neuropathy of both feet 09/17/2020   Palpitations 09/17/2020   Pilar cyst 09/17/2020   Stress due to family tension 09/17/2020   Tubular adenoma of colon 09/17/2020   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(HCC)     Past Surgical History:  Procedure Laterality Date   COLONOSCOPY W/ BIOPSIES AND POLYPECTOMY     RETINAL DETACHMENT SURGERY     age 37   WISDOM TOOTH EXTRACTION      Social History   Socioeconomic History   Marital status: Married    Spouse name: Magda Paganini   Number of children: 1   Years of education: 12   Highest education level: 12th grade  Occupational History   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, dispensing    Comment: Full time  Tobacco Use   Smoking status: Former    Types: Cigarettes   Smokeless tobacco: Never   Tobacco comments:    quit at age 46  Vaping Use   Vaping status: Never Used  Substance and Sexual Activity   Alcohol use: Yes    Comment: Occasionally   Drug use: No   Sexual activity: Not Currently  Other Topics Concern   Not on file  Social History Narrative   Lives with his family, (son and 2 grandchildren). 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   Social Drivers of Corporate investment banker Strain: Low Risk  (12/08/2022)   Overall Financial Resource Strain (CARDIA)    Difficulty of Paying Living Expenses: Not hard at all  Food Insecurity: No  Food Insecurity (12/08/2022)   Hunger Vital Sign    Worried About Running Out of Food in the Last Year: Never true    Ran Out of Food in the Last Year: Never true  Transportation Needs: No Transportation Needs (12/08/2022)   PRAPARE - Administrator, Civil Service (Medical): No    Lack of Transportation (Non-Medical): No  Physical Activity: Inactive (12/08/2022)   Exercise Vital Sign    Days of Exercise per Week: 0 days    Minutes of Exercise per Session: 0 min  Stress: No Stress Concern Present (12/08/2022)   [employer] of Occupational Health - Occupational Stress Questionnaire    Feeling of Stress : Not at all  Social Connections: Unknown (03/19/2023)   [facility]   Social Network    Social Network: Not on file  Intimate Partner Violence: Unknown (03/19/2023)   Received from [facility]   HITS    Physically Hurt: Not on file    Insult or Talk Down To: Not on file  Threaten Physical Harm: Not on file    Scream or Curse: Not on file    Outpatient Encounter Medications as of 10/11/2023  Medication Sig   amitriptyline (ELAVIL) 25 MG tablet Take 1 tablet (25 mg total) by mouth at bedtime. NEEDS APPOINTMENT FOR FURTHER REFILLS.   Ascorbic Acid (VITAMIN C) 1000 MG tablet Take 1,000 mg by mouth daily.   atorvastatin (LIPITOR) 40 MG tablet Take 1 tablet (40 mg total) by mouth daily.   Biotin 5000 MCG TABS Take 1 tablet by mouth daily.   BL GINKGO BILOBA PO Take 60 mg by mouth.   Calcium-Phosphorus-Vitamin D (CITRACAL +D3 PO) Take 650 mg by mouth.   cetirizine (ZYRTEC) 10 MG tablet TAKE 1 TABLET (10 MG TOTAL) BY MOUTH DAILY. NEEDS APPOINTMENT FOR FURTHER REFILLS.   co-enzyme Q-10 30 MG capsule Take 100 mg by mouth 3 (three) times daily.   ipratropium (ATROVENT) 0.06 % nasal spray Place 2 sprays into both nostrils 2 (two) times daily.   LORazepam (ATIVAN) 1 MG tablet Take 1 tablet (1 mg total) by mouth daily as needed for anxiety.   Magnesium 200 MG TABS Take  1 tablet by mouth at bedtime.   omeprazole (PRILOSEC) 10 MG capsule Take 10 mg by mouth daily.    Probiotic Product (PROBIOTIC PO) Take by mouth.   SUPER B COMPLEX/C PO Take 500 mg by mouth.   temazepam (RESTORIL) 15 MG capsule Take 1-2 capsules (15-30 mg total) by mouth at bedtime as needed for sleep.   tirzepatide (MOUNJARO) 2.5 MG/0.5ML Pen Inject 2.5 mg into the skin once a week.   Semaglutide, 1 MG/DOSE, (OZEMPIC, 1 MG/DOSE,) 4 MG/3ML SOPN INJECT 1 MG ONCE A WEEK AS DIRECTED   [DISCONTINUED] metFORMIN (GLUCOPHAGE) 1000 MG tablet TAKE 1 TABLET BY MOUTH TWICE A DAY   [DISCONTINUED] Multiple Vitamins-Minerals (CENTRUM SILVER 50+MEN PO) Take 1 tablet by mouth daily.   [DISCONTINUED] Turmeric (QC TUMERIC COMPLEX PO) Take 1,500 mg by mouth.   No facility-administered encounter medications on file as of 10/11/2023.    Allergies  Allergen Reactions   Penicillins Shortness Of Breath and Swelling   Cephalexin Diarrhea    Review of Systems      Objective:  BP 124/68 (BP Location: Left Arm, Cuff Size: Large)   Pulse 80   Resp 20   Ht 5\' 10"  (1.778 m)   Wt 257 lb (116.6 kg)   SpO2 97%   BMI 36.88 kg/m    Wt Readings from Last 3 Encounters:  10/11/23 257 lb (116.6 kg)  06/07/23 260 lb (117.9 kg)  09/15/22 256 lb (116.1 kg)    Physical Exam  Results for orders placed or performed in visit on 10/11/23  POCT UA - Microalbumin   Collection Time: 10/11/23  1:48 PM  Result Value Ref Range   Microalbumin Ur, POC 80 mg/L   Creatinine, POC 100 mg/dL   Albumin/Creatinine Ratio, Urine, POC 30-300   POCT URINALYSIS DIP (CLINITEK)   Collection Time: 10/11/23  1:48 PM  Result Value Ref Range   Color, UA yellow yellow   Clarity, UA clear clear   Glucose, UA >=1,000 (A) negative mg/dL   Bilirubin, UA negative negative   Ketones, POC UA negative negative mg/dL   Spec Grav, UA 4.098 1.191 - 1.025   Blood, UA negative negative   pH, UA 8.5 (A) 5.0 - 8.0   POC PROTEIN,UA =30 (A)  negative, trace   Urobilinogen, UA 0.2 0.2 or 1.0 E.U./dL   Nitrite,  UA Negative Negative   Leukocytes, UA Negative Negative  POCT HgB A1C   Collection Time: 10/11/23  1:49 PM  Result Value Ref Range   Hemoglobin A1C 10.2 (A) 4.0 - 5.6 %   HbA1c POC (<> result, manual entry)     HbA1c, POC (prediabetic range)     HbA1c, POC (controlled diabetic range) 10.2 (A) 0.0 - 7.0 %       Pertinent labs & imaging results that were available during my care of the patient were reviewed by me and considered in my medical decision making.   Continue healthy lifestyle choices, including diet (rich in fruits, vegetables, and lean proteins, and low in salt and simple carbohydrates) and exercise (at least 30 minutes of moderate physical activity daily).   The above assessment and management plan was discussed with the patient. The patient verbalized understanding of and has agreed to the management plan. Patient is aware to call the clinic if they develop any new symptoms or if symptoms persist or worsen. Patient is aware when to return to the clinic for a follow-up visit. Patient educated on when it is appropriate to go to the emergency department.   Maryelizabeth Kaufmann Student AGNP

## 2023-10-12 ENCOUNTER — Encounter: Payer: Self-pay | Admitting: Medical-Surgical

## 2023-10-12 ENCOUNTER — Other Ambulatory Visit: Payer: Self-pay | Admitting: Medical-Surgical

## 2023-10-12 LAB — CMP14+EGFR
ALT: 36 [IU]/L (ref 0–44)
AST: 24 [IU]/L (ref 0–40)
Albumin: 3.9 g/dL (ref 3.9–4.9)
Alkaline Phosphatase: 110 [IU]/L (ref 44–121)
BUN/Creatinine Ratio: 17 (ref 10–24)
BUN: 15 mg/dL (ref 8–27)
Bilirubin Total: 0.4 mg/dL (ref 0.0–1.2)
CO2: 22 mmol/L (ref 20–29)
Calcium: 9.1 mg/dL (ref 8.6–10.2)
Chloride: 101 mmol/L (ref 96–106)
Creatinine, Ser: 0.86 mg/dL (ref 0.76–1.27)
Globulin, Total: 2.6 g/dL (ref 1.5–4.5)
Glucose: 288 mg/dL — ABNORMAL HIGH (ref 70–99)
Potassium: 4.5 mmol/L (ref 3.5–5.2)
Sodium: 137 mmol/L (ref 134–144)
Total Protein: 6.5 g/dL (ref 6.0–8.5)
eGFR: 94 mL/min/{1.73_m2} (ref >59)

## 2023-10-12 LAB — CBC WITH DIFFERENTIAL/PLATELET
Basophils Absolute: 0 10*3/uL (ref 0.0–0.2)
Basos: 1 %
EOS (ABSOLUTE): 0.4 10*3/uL (ref 0.0–0.4)
Eos: 5 %
Hematocrit: 51.2 % — ABNORMAL HIGH (ref 37.5–51.0)
Hemoglobin: 15.8 g/dL (ref 13.0–17.7)
Immature Grans (Abs): 0 10*3/uL (ref 0.0–0.1)
Immature Granulocytes: 0 %
Lymphocytes Absolute: 2.2 10*3/uL (ref 0.7–3.1)
Lymphs: 32 %
MCH: 26.2 pg — ABNORMAL LOW (ref 26.6–33.0)
MCHC: 30.9 g/dL — ABNORMAL LOW (ref 31.5–35.7)
MCV: 85 fL (ref 79–97)
Monocytes Absolute: 0.5 10*3/uL (ref 0.1–0.9)
Monocytes: 7 %
Neutrophils Absolute: 3.8 10*3/uL (ref 1.4–7.0)
Neutrophils: 55 %
Platelets: 247 10*3/uL (ref 150–450)
RBC: 6.04 x10E6/uL — ABNORMAL HIGH (ref 4.14–5.80)
RDW: 13.1 % (ref 11.6–15.4)
WBC: 6.8 10*3/uL (ref 3.4–10.8)

## 2023-10-12 LAB — PSA TOTAL (REFLEX TO FREE): Prostate Specific Ag, Serum: 1.1 ng/mL (ref 0.0–4.0)

## 2023-10-12 LAB — LIPID PANEL
Chol/HDL Ratio: 4.5 {ratio} (ref 0.0–5.0)
Cholesterol, Total: 165 mg/dL (ref 100–199)
HDL: 37 mg/dL — ABNORMAL LOW (ref >39)
LDL Chol Calc (NIH): 81 mg/dL (ref 0–99)
Triglycerides: 283 mg/dL — ABNORMAL HIGH (ref 0–149)
VLDL Cholesterol Cal: 47 mg/dL — ABNORMAL HIGH (ref 5–40)

## 2023-10-13 ENCOUNTER — Encounter: Payer: Self-pay | Admitting: Medical-Surgical

## 2023-10-13 LAB — URINE CULTURE: Organism ID, Bacteria: NO GROWTH

## 2023-10-14 ENCOUNTER — Other Ambulatory Visit: Payer: Self-pay | Admitting: Medical-Surgical

## 2023-10-18 NOTE — Telephone Encounter (Signed)
 Patient states he has upcoming cardiology appt schld for 11/09/23. He states  he is not having CP or shortness of breath- the only real symptom is palpitations. He states if symptoms become concerning  or worsen he will go to ER  Under a lot of home stressors currently.

## 2023-10-18 NOTE — Telephone Encounter (Signed)
 Please contact patient. Need to know if he is having chest pain or any other concerning symptoms. If so, he should go to the ED. If not, would recommend he call his cardiologist's office for an acute (ASAP) visit.

## 2023-10-19 ENCOUNTER — Ambulatory Visit: Payer: Self-pay | Admitting: Medical-Surgical

## 2023-10-19 NOTE — Telephone Encounter (Signed)
Copied from CRM [id number]. Topic: Clinical - Red Word Triage >> Oct 19, 2023 11:40 AM Nila Nephew wrote: Red Word that prompted transfer to Nurse Triage: EMT stated has a minor blockage in heart, wants just an EKG  Chief Complaint: palpitations/ heart questions Symptoms:  Palpitations, pounding heart beat, pale color in face, increased stress Frequency: Sunday Pertinent Negatives: Patient denies pale color in face today, SOB Disposition: [x] ED /[] Urgent Care (no appt availability in office) / [] Appointment(In office/virtual)/ []  [facility] [place] [facility]/ [] Home Care/ [] Refused Recommended Disposition /[]  Mobile Bus/ []  Follow-up with PCP Additional Notes: Sunday a stressful/tense family situation lead to pt becoming upset: wife called EMS due to pt becoming pale in color & sickly looking.  EMS arrived did EKG found heart blockage BP168/88, HR90: wanted to take pt to ED but pt refused. Pt calling today to request appt for EKG only: pt still having palpitations, pounding heart beats & uneasy feeling. Nurse recommended pt go to ED for evaluation and testing: pt verbalizing understanding.  Reason for Disposition . [1] Heart beating very rapidly (e.g., > 140 / minute) AND [2] present now  (Exception: During exercise.)  Answer Assessment - Initial Assessment Questions 1. DESCRIPTION: "Please describe your heart rate or heartbeat that you are having" (e.g., fast/slow, regular/irregular, skipped or extra beats, "palpitations")     Palpitations, pounding heart beat 2. ONSET: "When did it start?" (Minutes, hours or days)      Sunday 3. DURATION: "How long does it last" (e.g., seconds, minutes, hours)     Unknown  4. PATTERN "Does it come and go, or has it been constant since it started?"  "Does it get worse with exertion?"   "Are you feeling it now?"     Constant since starting on Sunday 5. TAP: "Using your hand, can you tap out what you are feeling on a chair or table in front of you, so that I  can hear?" (Note: not all patients can do this)       N/a 6. HEART RATE: "Can you tell me your heart rate?" "How many beats in 15 seconds?"  (Note: not all patients can do this)       HR 90 BP 168/88- Sunday  7. RECURRENT SYMPTOM: "Have you ever had this before?" If Yes, ask: "When was the last time?" and "What happened that time?"      N/a 8. CAUSE: "What do you think is causing the palpitations?"     unknown 9. CARDIAC HISTORY: "Do you have any history of heart disease?" (e.g., heart attack, angina, bypass surgery, angioplasty, arrhythmia)      N/a 10. OTHER SYMPTOMS: "Do you have any other symptoms?" (e.g., dizziness, chest pain, sweating, difficulty breathing)       Cough,, palpitations, pale color Sunday but not now 11. PREGNANCY: "Is there any chance you are pregnant?" "When was your last menstrual period?"       N/a  Protocols used: Heart Rate and Heartbeat Questions-A-AH

## 2023-11-05 ENCOUNTER — Other Ambulatory Visit: Payer: Self-pay | Admitting: Medical-Surgical

## 2023-11-05 DIAGNOSIS — E1149 Type 2 diabetes mellitus with other diabetic neurological complication: Secondary | ICD-10-CM

## 2023-11-07 ENCOUNTER — Other Ambulatory Visit: Payer: Self-pay | Admitting: Medical-Surgical
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
## 2023-11-09 ENCOUNTER — Encounter: Payer: Self-pay | Admitting: Cardiology

## 2023-11-09 ENCOUNTER — Ambulatory Visit: Payer: Medicare HMO | Attending: Cardiology | Admitting: Cardiology

## 2023-11-09 VITALS — BP 132/78 | HR 70 | Ht 70.0 in | Wt 252.0 lb

## 2023-11-09 DIAGNOSIS — E782 Mixed hyperlipidemia: Secondary | ICD-10-CM

## 2023-11-09 DIAGNOSIS — G4730 Sleep apnea, unspecified: Secondary | ICD-10-CM | POA: Diagnosis not present

## 2023-11-09 DIAGNOSIS — R0989 Other specified symptoms and signs involving the circulatory and respiratory systems: Secondary | ICD-10-CM

## 2023-11-09 DIAGNOSIS — E1149 Type 2 diabetes mellitus with other diabetic neurological complication: Secondary | ICD-10-CM | POA: Diagnosis not present

## 2023-11-09 DIAGNOSIS — R931 Abnormal findings on diagnostic imaging of heart and coronary circulation: Secondary | ICD-10-CM

## 2023-11-09 DIAGNOSIS — R002 Palpitations: Secondary | ICD-10-CM | POA: Diagnosis not present

## 2023-11-09 NOTE — Patient Instructions (Addendum)
 Medication Instructions:  No medication changes were made during today's visit.  *If you need a refill on your cardiac medications before your next appointment, please call your pharmacy*   Lab Work: No labs were ordered during today's visit.  If you have labs (blood work) drawn today and your tests are completely normal, you will receive your results only by: MyChart Message (if you have MyChart) OR A paper copy in the mail If you have any lab test that is abnormal or we need to change your treatment, we will call you to review the results.   Testing/Procedures:  Danny Morgan- Long Term Monitor Instructions   Your physician has requested you wear your ZIO patch monitor 14 days.   This is a single patch monitor.  [employer] supplies one patch monitor per enrollment.  Additional stickers are not available.   Please do not apply patch if you will be having a Nuclear Stress Test, Echocardiogram, Cardiac CT, MRI, or Chest Xray during the time frame you would be wearing the monitor. The patch cannot be worn during these tests.  You cannot remove and re-apply the ZIO XT patch monitor.   Your ZIO patch monitor will be sent USPS Priority mail from [facility] directly to your home address. The monitor may also be mailed to a PO BOX if home delivery is not available.   It may take 3-5 days to receive your monitor after you have been enrolled.   Once you have received you monitor, please review enclosed instructions.  Your monitor has already been registered assigning a specific monitor serial # to you.   Applying the monitor   Shave hair from upper left chest.   Hold abrader disc by orange tab.  Rub abrader in 40 strokes over left upper chest as indicated in your monitor instructions.   Clean area with 4 enclosed alcohol pads .  Use all pads to assure are is cleaned thoroughly.  Let dry.   Apply patch as indicated in monitor instructions.  Patch will be place under collarbone on left side  of chest with arrow pointing upward.   Rub patch adhesive wings for 2 minutes.Remove white label marked "1".  Remove white label marked "2".  Rub patch adhesive wings for 2 additional minutes.   While looking in a mirror, press and release button in center of patch.  A small green light will flash 3-4 times .  This will be your only indicator the monitor has been turned on.     Do not shower for the first 24 hours.  You may shower after the first 24 hours.   Press button if you feel a symptom. You will hear a small click.  Record Date, Time and Symptom in the Patient Log Book.   When you are ready to remove patch, follow instructions on last 2 pages of Patient Log Book.  Stick patch monitor onto last page of Patient Log Book.   Place Patient Log Book in [place] box.  Use locking tab on box and tape box closed securely.  The [employer] and [employer] has [employer] on it.  Please place in mailbox as soon as possible.  Your physician should have your test results approximately 7 days after the monitor has been mailed back to [facility].   Call [facility] Customer Care at [phone] if you have questions regarding your ZIO XT patch monitor.  Call them immediately if you see an orange light blinking on your monitor.  If your monitor falls off in less than 4 days contact our Monitor department at [phone].  If your monitor becomes loose or falls off after 4 days call [employer] at [phone] for suggestions on securing your monitor.     Follow-Up: At [facility], you and your health needs are our priority.  As part of our continuing mission to provide you with exceptional heart care, we have created designated Provider Care Teams.  These Care Teams include your primary Cardiologist (physician) and Advanced Practice Providers (APPs -  Physician Assistants and Nurse Practitioners) who all work together to provide you with the care you need, when you need it.  We recommend  signing up for the patient portal called "MyChart".  Sign up information is provided on this After Visit Summary.  MyChart is used to connect with patients for Virtual Visits (Telemedicine).  Patients are able to view lab/test results, encounter notes, upcoming appointments, etc.  Non-urgent messages can be sent to your provider as well.   To learn more about what you can do with MyChart, go to [email or web address].    Your next appointment:   6-8  week(s)  Provider:   [name], NP        Other Instructions

## 2023-12-14 ENCOUNTER — Ambulatory Visit (INDEPENDENT_AMBULATORY_CARE_PROVIDER_SITE_OTHER): Payer: Medicare HMO

## 2023-12-14 VITALS — BP 141/74 | HR 79 | Ht 69.0 in | Wt 257.0 lb

## 2023-12-14 DIAGNOSIS — E1149 Type 2 diabetes mellitus with other diabetic neurological complication: Secondary | ICD-10-CM | POA: Diagnosis not present

## 2023-12-14 DIAGNOSIS — Z0000 Encounter for general adult medical examination without abnormal findings: Secondary | ICD-10-CM | POA: Diagnosis not present

## 2023-12-14 MED ORDER — TIRZEPATIDE 2.5 MG/0.5ML ~~LOC~~ SOAJ: 2.5000 mg | SUBCUTANEOUS | 0 refills | Status: DC

## 2023-12-14 NOTE — Patient Instructions (Signed)
  [name] , Thank you for taking time to come for your Medicare Wellness Visit. I appreciate your ongoing commitment to your health goals. Please review the following plan we discussed and let me know if I can assist you in the future.   These are the goals we discussed:  Goals       Medication Management      Patient Goals/Self-Care Activities Over the next 30 days, patient will:  take medications as prescribed and collaborate with provider on medication access solutions  Follow Up Plan: Telephone appointment with care management team member scheduled for: 1 month      Patient Stated (pt-stated)      Patient stated that he would like to continue to loose weight.       Weight (lb) < 200 lb (90.7 kg)      He would like to lose some weight.         This is a list of the screening recommended for you and due dates:  Health Maintenance  Topic Date Due   Pneumonia Vaccine (1 of 2 - PCV) Never done   Hepatitis C Screening  Never done   DTaP/Tdap/Td vaccine (1 - Tdap) Never done   Eye exam for diabetics  10/02/2021   Flu Shot  04/07/2024   Hemoglobin A1C  04/09/2024   Yearly kidney function blood test for diabetes  10/10/2024   Yearly kidney health urinalysis for diabetes  10/10/2024   Complete foot exam   10/10/2024   Medicare Annual Wellness Visit  12/13/2024   Colon Cancer Screening  08/12/2025   HPV Vaccine  Aged Out   COVID-19 Vaccine  Discontinued   Zoster (Shingles) Vaccine  Discontinued

## 2023-12-14 NOTE — Progress Notes (Signed)
 Subjective:   Danny Morgan. Danny Morgan. is a 68 y.o. male who presents for Medicare Annual/Subsequent preventive examination.  Visit Complete: In person  Patient Medicare AWV questionnaire was completed by the patient on n/a; I have confirmed that all information answered by patient is correct and no changes since this date.  Cardiac Risk Factors include: advanced age (>63men, >63 women);male gender;dyslipidemia;smoking/ tobacco exposure;obesity (BMI >30kg/m2);diabetes mellitus;family history of premature cardiovascular disease     Objective:    Today's Vitals   12/14/23 1600  BP: (!) 143/76  Pulse: 79  SpO2: 97%  Weight: 257 lb (116.6 kg)  Height: 5\' 9"  (1.753 m)   Body mass index is 37.95 kg/m.     12/14/2023    4:32 PM 12/08/2022    4:08 PM 11/08/2017    7:37 AM  Advanced Directives  Does Patient Have a Medical Advance Directive? No Yes Yes  Type of Advance Directive  Living will Healthcare Power of Attorney  Does patient want to make changes to medical advance directive?  No - Patient declined   Would patient like information on creating a medical advance directive? No - Patient declined      Current Medications (verified) Outpatient Encounter Medications as of 12/14/2023  Medication Sig   amitriptyline (ELAVIL) 25 MG tablet Take 1 tablet (25 mg total) by mouth at bedtime. NEEDS APPOINTMENT FOR FURTHER REFILLS.   Ascorbic Acid (VITAMIN C) 1000 MG tablet Take 1,000 mg by mouth daily.   atorvastatin (LIPITOR) 40 MG tablet Take 1 tablet (40 mg total) by mouth daily.   Berberine Chloride (BERBERINE HCI) 500 MG CAPS Take by mouth.   Biotin 5000 MCG TABS Take 1 tablet by mouth daily.   BL GINKGO BILOBA PO Take 60 mg by mouth.   cetirizine (ZYRTEC) 10 MG tablet TAKE 1 TABLET BY MOUTH EVERY DAY   co-enzyme Q-10 30 MG capsule Take 100 mg by mouth 3 (three) times daily.   ipratropium (ATROVENT) 0.06 % nasal spray PLACE 2 SPRAYS INTO BOTH NOSTRILS 2 (TWO) TIMES DAILY   LORazepam  (ATIVAN) 1 MG tablet Take 1 tablet (1 mg total) by mouth daily as needed for anxiety.   Magnesium 200 MG TABS Take 1 tablet by mouth at bedtime.   omeprazole (PRILOSEC) 10 MG capsule Take 10 mg by mouth daily.    Probiotic Product (PROBIOTIC PO) Take by mouth.   SUPER B COMPLEX/C PO Take 500 mg by mouth.   temazepam (RESTORIL) 15 MG capsule Take 1-2 capsules (15-30 mg total) by mouth at bedtime as needed for sleep.   VITAMIN D-VITAMIN K PO Take by mouth.   tirzepatide (MOUNJARO) 2.5 MG/0.5ML Pen Inject 2.5 mg into the skin once a week. (Patient not taking: Reported on 12/14/2023)   [DISCONTINUED] Calcium-Phosphorus-Vitamin D (CITRACAL +D3 PO) Take 650 mg by mouth.   [DISCONTINUED] Semaglutide, 1 MG/DOSE, (OZEMPIC, 1 MG/DOSE,) 4 MG/3ML SOPN INJECT 1 MG ONCE A WEEK AS DIRECTED (Patient not taking: Reported on 11/09/2023)   No facility-administered encounter medications on file as of 12/14/2023.    Allergies (verified) Penicillins and Cephalexin   History: Past Medical History:  Diagnosis Date   Adjustment disorder with mixed anxiety and depressed mood 09/17/2020   Allergy    Angio-edema    Anxiety    Body mass index (BMI) 39.0-39.9, adult 09/17/2020   Cardiac murmur 06/24/2020   Cataract    beginning stage in rt. eye   Chest discomfort 06/24/2020   Colon polyps    Diabetes  mellitus without complication (HCC)    DOE (dyspnea on exertion) 06/24/2020   Eczema    Epistaxis 11/12/2020   Erectile dysfunction due to type 2 diabetes mellitus (HCC) 11/12/2020   GERD (gastroesophageal reflux disease)    Hyperlipidemia    states cholesterol normal states being given to protect kidneys   Impingement syndrome, shoulder, left 08/28/2021   Insomnia 09/17/2020   Lumbar spinal stenosis 08/28/2021   Morbid obesity (HCC) 06/24/2020   Neuropathy of both feet 09/17/2020   Palpitations 09/17/2020   Pilar cyst 09/17/2020   Stress due to family tension 09/17/2020   Tubular adenoma of colon 09/17/2020   Type 2  diabetes mellitus with neurological complications (HCC)    Past Surgical History:  Procedure Laterality Date   COLONOSCOPY W/ BIOPSIES AND POLYPECTOMY     RETINAL DETACHMENT SURGERY     age 69   WISDOM TOOTH EXTRACTION     Family History  Problem Relation Age of Onset   Parkinson's disease Father    Colon cancer Neg Hx    Stomach cancer Neg Hx    Pancreatic cancer Neg Hx    Esophageal cancer Neg Hx    Rectal cancer Neg Hx    Allergic rhinitis Neg Hx    Asthma Neg Hx    Eczema Neg Hx    Urticaria Neg Hx    Social History   Socioeconomic History   Marital status: Married    Spouse name: Magda Paganini   Number of children: 1   Years of education: 12   Highest education level: 12th grade  Occupational History   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, dispensing    Comment: Full time  Tobacco Use   Smoking status: Former    Current packs/day: 0.00    Average packs/day: 0.5 packs/day for 5.8 years (2.9 ttl pk-yrs)    Types: Cigarettes    Start date: 02/12/1972    Quit date: 11/13/1977    Years since quitting: 46.1   Smokeless tobacco: Never   Tobacco comments:    quit at age 73  Vaping Use   Vaping status: Never Used  Substance and Sexual Activity   Alcohol use: Yes    Comment: Occasionally   Drug use: No   Sexual activity: Yes    Partners: Female    Comment: married  Other Topics Concern   Not on file  Social History Narrative   Lives with his family, (son and 2 grandchildren). 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   Social Drivers of Corporate investment banker Strain: Low Risk  (12/14/2023)   Overall Financial Resource Strain (CARDIA)    Difficulty of Paying Living Expenses: Not hard at all  Food Insecurity: No Food Insecurity (12/14/2023)   Hunger Vital Sign    Worried About Running Out of Food in the Last Year: Never true    Ran Out of Food in the Last Year: Never true  Transportation Needs: No Transportation Needs (12/14/2023)   PRAPARE - Administrator, Civil Service  (Medical): No    Lack of Transportation (Non-Medical): No  Physical Activity: Sufficiently Active (12/14/2023)   Exercise Vital Sign    Days of Exercise per Week: 4 days    Minutes of Exercise per Session: 60 min  Stress: Stress Concern Present (12/14/2023)   Danny Morgan - Occupational Stress Questionnaire    Feeling of Stress : To some extent  Social Connections: Moderately Isolated (12/14/2023)   Social Connection and Isolation Panel [NHANES]  Frequency of Communication with Friends and Family: More than three times a week    Frequency of Social Gatherings with Friends and Family: More than three times a week    Attends Religious Services: Never    Database administrator or Organizations: No    Attends Engineer, structural: Never    Marital Status: Married    Tobacco Counseling Counseling given: Not Answered Tobacco comments: quit at age 38   Clinical Intake:  Pre-visit preparation completed: Yes  Pain : No/denies pain     BMI - recorded: 37.95 Nutritional Status: BMI > 30  Obese Nutritional Risks: None Diabetes: Yes CBG done?: No Did pt. bring in CBG monitor from home?: No  How often do you need to have someone help you when you read instructions, pamphlets, or other written materials from your doctor or pharmacy?: 1 - Never What is the last grade level you completed in school?: 13  Interpreter Needed?: No      Activities of Daily Living    12/14/2023    4:15 PM  In your present state of health, do you have any difficulty performing the following activities:  Hearing? 0  Vision? 0  Difficulty concentrating or making decisions? 0  Walking or climbing stairs? 1  Comment climbing stairs  Dressing or bathing? 0  Doing errands, shopping? 0  Preparing Food and eating ? N  Using the Toilet? N  In the past six months, have you accidently leaked urine? N  Do you have problems with loss of bowel control? N  Managing your  Medications? N  Housekeeping or managing your Housekeeping? N    Patient Care Team: Christen Butter, NP as PCP - General (Nurse Practitioner) Lewayne Bunting, MD as PCP - Cardiology (Cardiology) Karie Soda, MD as Consulting Physician (General Surgery)  Indicate any recent Medical Services you may have received from other than [facility] providers in the past year (date may be approximate).     Assessment:   This is a routine wellness examination for [place].  Hearing/Vision screen No results found.   Goals Addressed             This Visit's Progress    Weight (lb) < 200 lb (90.7 kg)   257 lb (116.6 kg)    He would like to lose some weight.       Depression Screen    12/14/2023    4:30 PM 12/08/2022    4:08 PM 09/11/2022   11:37 AM 08/06/2021    2:39 PM 04/23/2021    4:34 PM 09/17/2020    2:54 PM 06/17/2020    9:45 AM  PHQ 2/9 Scores  PHQ - 2 Score 1 0 2 0 1 2 2   PHQ- 9 Score     6 10 6     Fall Risk    12/14/2023    4:32 PM 12/08/2022    4:08 PM 09/11/2022   11:37 AM 08/06/2021    2:34 PM 06/17/2020    9:45 AM  Fall Risk   Falls in the past year? 0 0 0 0 0  Number falls in past yr: 0 0 0 0 0  Injury with Fall? 0 0 0 0 0  Risk for fall due to : No Fall Risks No Fall Risks No Fall Risks No Fall Risks   Follow up Falls evaluation completed Falls evaluation completed Falls evaluation completed Falls evaluation completed Falls evaluation completed    MEDICARE RISK AT HOME: Medicare Risk  at Home Any stairs in or around the home?: Yes If so, are there any without handrails?: Yes Home free of loose throw rugs in walkways, pet beds, electrical cords, etc?: Yes Adequate lighting in your home to reduce risk of falls?: Yes Life alert?: No Use of a cane, walker or w/c?: No Grab bars in the bathroom?: No Shower chair or bench in shower?: No Elevated toilet seat or a handicapped toilet?: Yes  TIMED UP AND GO:  Was the test performed?  Yes  Length of time to ambulate 10 feet: 8  sec Gait steady and fast without use of assistive device    Cognitive Function:        12/14/2023    4:33 PM 12/08/2022    4:22 PM  6CIT Screen  What Year? 0 points 0 points  What month? 0 points 0 points  What time? 0 points 0 points  Count back from 20 0 points 0 points  Months in reverse 0 points 0 points  Repeat phrase 0 points 2 points  Total Score 0 points 2 points    Immunizations Immunization History  Administered Date(s) Administered   PFIZER(Purple Top)SARS-COV-2 Vaccination 01/29/2020, 02/20/2020    TDAP status: Due, Education has been provided regarding the importance of this vaccine. Advised may receive this vaccine at local pharmacy or [facility] Dept. Aware to provide a copy of the vaccination record if obtained from local pharmacy or [facility]. Verbalized acceptance and understanding.  Flu Vaccine status: Declined, Education has been provided regarding the importance of this vaccine but patient still declined. Advised may receive this vaccine at local pharmacy or [facility]. Aware to provide a copy of the vaccination record if obtained from local pharmacy or [facility]. Verbalized acceptance and understanding.  Pneumococcal vaccine status: Declined,  Education has been provided regarding the importance of this vaccine but patient still declined. Advised may receive this vaccine at local pharmacy or [facility]. Aware to provide a copy of the vaccination record if obtained from local pharmacy or [facility]. Verbalized acceptance and understanding.   Covid-19 vaccine status: Declined, Education has been provided regarding the importance of this vaccine but patient still declined. Advised may receive this vaccine at local pharmacy or [facility].or vaccine clinic. Aware to provide a copy of the vaccination record if obtained from local pharmacy or [facility]. Verbalized acceptance and understanding.  Qualifies for Shingles Vaccine? Yes   Zostavax completed No   Shingrix  Completed?: No.    Education has been provided regarding the importance of this vaccine. Patient has been advised to call insurance company to determine out of pocket expense if they have not yet received this vaccine. Advised may also receive vaccine at local pharmacy or [facility]. Verbalized acceptance and understanding.  Screening Tests Morgan Maintenance  Topic Date Due   Pneumonia Vaccine 41+ Years old (1 of 2 - PCV) Never done   Hepatitis C Screening  Never done   DTaP/Tdap/Td (1 - Tdap) Never done   OPHTHALMOLOGY EXAM  10/02/2021   INFLUENZA VACCINE  04/07/2024   HEMOGLOBIN A1C  04/09/2024   Diabetic kidney evaluation - eGFR measurement  10/10/2024   Diabetic kidney evaluation - Urine ACR  10/10/2024   FOOT EXAM  10/10/2024   Medicare Annual Wellness (AWV)  12/13/2024   Colonoscopy  08/12/2025   HPV VACCINES  Aged Out   COVID-19 Vaccine  Discontinued   Zoster Vaccines- Shingrix  Discontinued    Morgan Maintenance  Morgan Maintenance Due  Topic Date Due   Pneumonia Vaccine 25+ Years old (1 of 2 - PCV) Never done   Hepatitis C Screening  Never done   DTaP/Tdap/Td (1 - Tdap) Never done   OPHTHALMOLOGY EXAM  10/02/2021    Colorectal cancer screening: Type of screening: Colonoscopy. Completed 08/12/2020. Repeat every 5 years  Lung Cancer Screening: (Low Dose CT Chest recommended if Age 49-80 years, 20 pack-year currently smoking OR have quit w/in 15years.) does not qualify.   Lung Cancer Screening Referral: n/a  Additional Screening:  Hepatitis C Screening: does qualify; Completed not yet  Vision Screening: Recommended annual ophthalmology exams for early detection of glaucoma and other disorders of the eye. Is the patient up to date with their annual eye exam?  No  Who is the provider or what is the name of the office in which the patient attends annual eye exams? Referral was placed at last visit If pt is not established with a provider, would they like to be referred  to a provider to establish care?  N/a .   Dental Screening: Recommended annual dental exams for proper oral hygiene  Diabetic Foot Exam: Diabetic Foot Exam: Completed 10/11/2023  Community Resource Referral / Chronic Care Management: CRR required this visit?  No   CCM required this visit?  No     Plan:     I have personally reviewed and noted the following in the patient's chart:   Medical and social history Use of alcohol, tobacco or illicit drugs  Current medications and supplements including opioid prescriptions. Patient is not currently taking opioid prescriptions. Functional ability and status Nutritional status Physical activity Advanced directives List of other physicians Hospitalizations, surgeries, and ER visits in previous 12 months Vitals Screenings to include cognitive, depression, and falls Referrals and appointments  In addition, I have reviewed and discussed with patient certain preventive protocols, quality metrics, and best practice recommendations. A written personalized care plan for preventive services as well as general preventive Morgan recommendations were provided to patient.     Esmond Harps, CMA   12/14/2023   After Visit Summary: (In Person-Declined) Patient declined AVS at this time.  Nurse Notes:   Danny Morgan. Mick Tanguma. is a 68 y.o. male patient of Christen Butter, NP who had a Medicare Annual Wellness Visit today via telephone. Cobi is Working full time and lives with their family. He has 1 child. he reports that he is socially active and does interact with friends/family regularly. 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

## 2023-12-23 NOTE — Progress Notes (Unsigned)
 Cardiology Office Note    Date:  12/25/2023  ID:  Rubye Corpus. Sameul Crew., DOB Dec 16, 1955, MRN [id number] PCP:  Cherre Cornish, NP  Cardiologist:  Alexandria Angel, MD  Electrophysiologist:  None   Chief Complaint: Follow up for palpitations   History of Present Illness: .    Rubye Corpus. Koen Antilla. is a 68 y.o. male with visit-pertinent history of palpitations, hyperlipidemia and type 2 diabetes mellitus.  Previously evaluated by Dr. Lafayette Pierre, patient transferred care to Dr. Audery Blazing and was first evaluated on 06/07/2023.  In 07/2120 patient had a nuclear study that showed ejection fraction of 67% and no ischemia or infarction.  He wore a cardiac monitor in 10/2022 that showed an average heart rate of 79 bpm, ranging from 43 287 bpm.  Predominant underlying rhythm was sinus rhythm.  First-degree AV block was present he had 1 run of ventricular tachycardia lasting 4 beats with a max rate of 118 bpm, he had 33 runs of supraventricular tachycardia, the run with the fastest interval lasting eleven beats with a max rate of 187 bpm, the longest lasting 19 beats an average of 154 bpm.  He had frequent PACs at 7.7%.  There was no evidence of atrial fibrillation or flutter.  Echocardiogram on 11/04/2022 indicated LVEF of 60 to 65%, no RWMA, diastolic parameters were normal, there were no significant valvular abnormalities.  Patient was seen by Dr. Audery Blazing on 06/07/2023, patient denied shortness of breath, chest pain or syncope.  He did occasionally feel heart fluttering sensation.  Toprol was discussed but patient preferred to avoid medications.  Patient's coronary calcium  score was 204, this was 60th percentile for age, race and sex matched controls.  He was started on aspirin  81 mg daily.  Patient was noted to abdominal bruit on exam, ultrasound indicated a 3 cm proximal abdominal aortic aneurysm with recommended follow-up in 3 years.  Patient last seen in office on 11/09/2023 regarding increased palpitations.   Patient noted he been having significant stress with his current home situation and it [facility] been causing more palpitations.  Patient noted that majority of the time he was unaware of palpitations except for at night.  Cardiac monitor was recommended, unfortunately does not appear to have been completed.  Today he presents for follow up, he reports that he is doing ok. Notes ongoing back problems and pain, is trying not undergo surgery. Continues to note difficult family situations at home. He denies chest pain, shortness of breath, lower extremity edema, orthopnea or pnd.  He reports that his palpitations have improved, are not as frequent or bothersome.  He reports that he never received his cardiac monitor, given his palpitations have improved he does feel he needs to wear cardiac monitor.Rito Chess independently reviewed: 10/11/2023: Sodium 137, potassium 4.5, creatinine 0.86, hemoglobin 15.8, hematocrit 51.2 ROS: .   Today he denies chest pain, shortness of breath, lower extremity edema, fatigue, palpitations, melena, hematuria, hemoptysis, diaphoresis, weakness, presyncope, syncope, orthopnea, and PND.  All other systems are reviewed and otherwise negative. Studies Reviewed: Aaron Aas    EKG:  EKG is not ordered today.  CV Studies: Cardiac studies reviewed are outlined and summarized above. Otherwise please see EMR for full report. Cardiac Studies & Procedures   ______________________________________________________________________________________________   STRESS TESTS  MYOCARDIAL PERFUSION IMAGING 07/23/2020  Narrative  The left ventricular ejection fraction is hyperdynamic (>65%).  Nuclear stress EF: 67%.  There was no ST segment deviation noted during stress.  The study is normal.  This is  a low risk study.  Normal stress nuclear study with no ischemia or infarction.  Gated ejection fraction 67% with normal wall motion.   ECHOCARDIOGRAM  ECHOCARDIOGRAM COMPLETE  11/04/2022  Narrative ECHOCARDIOGRAM REPORT    Patient Name:   Jos Cygan. Sameul Crew. Date of Exam: 11/04/2022 Medical Rec #:  [id number]              Height:       70.0 in Accession #:    [id number]             Weight:       256.0 lb Date of Birth:  Oct 13, 1955              BSA:          2.318 m Patient Age:    66 years               BP:           141/83 mmHg Patient Gender: M                      HR:           76 bpm. Exam Location:  [address]  Procedure: 2D Echo, Cardiac Doppler, Color Doppler and 3D Echo  Indications:    Intermittent Palpitations R00.2  History:        Patient has prior history of Echocardiogram examinations, most recent 07/22/2020. Signs/Symptoms:Murmur; Risk Factors:Diabetes and Dyslipidemia.  Sonographer:    Joleen Navy RDCS Referring Phys: [id number] JOY JESSUP  IMPRESSIONS   1. Left ventricular ejection fraction, by estimation, is 60 to 65%. The left ventricle has normal function. The left ventricle has no regional wall motion abnormalities. Left ventricular diastolic parameters were normal. 2. Right ventricular systolic function is normal. The right ventricular size is normal. 3. The mitral valve is normal in structure. No evidence of mitral valve regurgitation. No evidence of mitral stenosis. 4. The aortic valve is normal in structure. Aortic valve regurgitation is not visualized. No aortic stenosis is present. 5. The inferior vena cava is normal in size with greater than 50% respiratory variability, suggesting right atrial pressure of 3 mmHg.  FINDINGS Left Ventricle: Left ventricular ejection fraction, by estimation, is 60 to 65%. The left ventricle has normal function. The left ventricle has no regional wall motion abnormalities. The left ventricular internal cavity size was normal in size. There is no left ventricular hypertrophy. Left ventricular diastolic parameters were normal.  Right Ventricle: The right ventricular size is normal. No  increase in right ventricular wall thickness. Right ventricular systolic function is normal.  Left Atrium: Left atrial size was normal in size.  Right Atrium: Right atrial size was normal in size.  Pericardium: There is no evidence of pericardial effusion.  Mitral Valve: The mitral valve is normal in structure. No evidence of mitral valve regurgitation. No evidence of mitral valve stenosis.  Tricuspid Valve: The tricuspid valve is normal in structure. Tricuspid valve regurgitation is not demonstrated. No evidence of tricuspid stenosis.  Aortic Valve: The aortic valve is normal in structure. Aortic valve regurgitation is not visualized. No aortic stenosis is present.  Pulmonic Valve: The pulmonic valve was normal in structure. Pulmonic valve regurgitation is not visualized. No evidence of pulmonic stenosis.  Aorta: The aortic root is normal in size and structure.  Venous: The inferior vena cava is normal in size with greater than 50% respiratory variability, suggesting right atrial pressure of 3 mmHg.  IAS/Shunts: No  atrial level shunt detected by color flow Doppler.   LEFT VENTRICLE PLAX 2D LVIDd:         4.90 cm   Diastology LVIDs:         2.80 cm   LV e' medial:    7.18 cm/s LV PW:         1.10 cm   LV E/e' medial:  9.6 LV IVS:        1.10 cm   LV e' lateral:   7.62 cm/s LVOT diam:     2.40 cm   LV E/e' lateral: 9.0 LV SV:         96 LV SV Index:   42 LVOT Area:     4.52 cm  3D Volume EF: 3D EF:        60 % LV EDV:       137 ml LV ESV:       55 ml LV SV:        83 ml  RIGHT VENTRICLE RV Basal diam:  4.00 cm RV Mid diam:    4.00 cm RV S prime:     10.19 cm/s TAPSE (M-mode): 2.2 cm  LEFT ATRIUM             Index        RIGHT ATRIUM           Index LA diam:        4.10 cm 1.77 cm/m   RA Area:     18.70 cm LA Vol (A2C):   68.1 ml 29.38 ml/m  RA Volume:   40.30 ml  17.38 ml/m LA Vol (A4C):   46.3 ml 19.97 ml/m LA Biplane Vol: 58.5 ml 25.23 ml/m AORTIC  VALVE LVOT Vmax:   100.00 cm/s LVOT Vmean:  69.700 cm/s LVOT VTI:    0.213 m  AORTA Ao Root diam: 3.70 cm Ao Asc diam:  3.90 cm  MITRAL VALVE MV Area (PHT): 2.80 cm    SHUNTS MV Decel Time: 271 msec    Systemic VTI:  0.21 m MV E velocity: 68.70 cm/s  Systemic Diam: 2.40 cm MV A velocity: 60.80 cm/s MV E/A ratio:  1.13  Aditya Sabharwal Electronically signed by Alwin Baars Signature Date/Time: 11/04/2022/3:27:38 PM    Final    MONITORS  LONG TERM MONITOR (3-14 DAYS) 10/26/2022  Narrative Patch Wear Time:  13 days and 23 hours (2024-01-28T11:00:48-0500 to 2024-02-11T11:00:44-0500)  Patient had a min HR of 43 bpm, max HR of 187 bpm, and avg HR of 79 bpm. Predominant underlying rhythm was Sinus Rhythm. First Degree AV Block was present. 1 run of Ventricular Tachycardia occurred lasting 4 beats with a max rate of 118 bpm (avg 115 bpm). 33 Supraventricular Tachycardia runs occurred, the run with the fastest interval lasting 11 beats with a max rate of 187 bpm, the longest lasting 19 beats with an avg rate of 154 bpm. Isolated SVEs were frequent (7.7%, 161096), SVE Couplets were rare (<1.0%, 608), and SVE Triplets were rare (<1.0%, 65). Isolated VEs were rare (<1.0%), VE Couplets were rare (<1.0%), and no VE Triplets were present.  SUMMARY: The basic rhythm is normal sinus with an average HR of 79 bpm No atrial fibrillation or flutter No high-grade heart block or pathologic pauses There are rare PVC's with no sustained ventricular runs (longest run 4 beats) Frequent supraventricular beats (7.7%) with short supraventricular runs   CT SCANS  CT CARDIAC SCORING (SELF PAY ONLY) 06/17/2023  Addendum 07/10/2023  7:31 PM ADDENDUM REPORT: 07/10/2023 19:28  EXAM: OVER-READ INTERPRETATION  CT CHEST  The following report is an over-read performed by radiologist Dr. Cyndia Drape [facility] on 07/10/2023. This over-read does not include interpretation of  cardiac or coronary anatomy or pathology. The coronary CTA interpretation by the cardiologist is attached.  COMPARISON:  None.  FINDINGS: Cardiovascular:  See findings discussed in the body of the report.  Mediastinum/Nodes: No suspicious adenopathy identified. Imaged mediastinal structures are unremarkable.  Lungs/Pleura: Imaged lungs are clear. No pleural effusion or pneumothorax.  Upper Abdomen: No acute abnormality.  Musculoskeletal: No chest wall abnormality. No acute osseous findings.  IMPRESSION: No acute extracardiac incidental findings identified.   Electronically Signed By: Sydell Eva M.D. On: 07/10/2023 19:28  Narrative CLINICAL DATA:  Cardiovascular disease risk stratification  CAD screening, low CAD risk  EXAM: CT Coronary Calcium  Score  TECHNIQUE: A gated, non-contrast computed tomography scan of the heart was performed using 3mm slice thickness. Axial images were analyzed on a dedicated workstation. Calcium  scoring of the coronary arteries was performed using the Agatston method.  FINDINGS: Coronary Calcium  Score:  Left main: 0  Left anterior descending artery: 196  Left circumflex artery: 0  Right coronary artery: 8  Total: 204  Percentile: 60th  Pericardium: Normal.  Ascending Aorta: Borderline size. Ascending aorta measures approximately 39mm at the mid ascending aorta measured in a non-contrast axial plane.  Non-cardiac: See separate report from [facility].  IMPRESSION: Coronary calcium  score of 204. This was 60th percentile for age-, race-, and sex-matched controls.  RECOMMENDATIONS: Coronary artery calcium  (CAC) score is a strong predictor of incident coronary heart disease (CHD) and provides predictive information beyond traditional risk factors. CAC scoring is reasonable to use in the decision to withhold, postpone, or initiate statin therapy in intermediate-risk or selected borderline-risk asymptomatic  adults (age 91-75 years and LDL-C >=70 to <190 mg/dL) who do not have diabetes or established atherosclerotic cardiovascular disease (ASCVD).* In intermediate-risk (10-year ASCVD risk >=7.5% to <20%) adults or selected borderline-risk (10-year ASCVD risk >=5% to <7.5%) adults in whom a CAC score is measured for the purpose of making a treatment decision the following recommendations have been made:  If CAC=0, it is reasonable to withhold statin therapy and reassess in 5 to 10 years, as long as higher risk conditions are absent (diabetes mellitus, family history of premature CHD in first degree relatives (males <55 years; females <65 years), cigarette smoking, or LDL >=190 mg/dL).  If CAC is 1 to 99, it is reasonable to initiate statin therapy for patients >=48 years of age.  If CAC is >=100 or >=75th percentile, it is reasonable to initiate statin therapy at any age.  Cardiology referral should be considered for patients with CAC scores >=400 or >=75th percentile.  *2018 AHA/ACC/AACVPR/AAPA/ABC/ACPM/ADA/AGS/APhA/ASPC/NLA/PCNA Guideline on the Management of Blood Cholesterol: A Report of the American College of Cardiology/American Heart Association Task Force on Clinical Practice Guidelines. J Am Coll Cardiol. 2019;73(24):3168-3209.  Electronically Signed: By: Grady Lawman M.D. On: 06/18/2023 09:26     ______________________________________________________________________________________________       Current Reported Medications:.    Current Meds  Medication Sig   amitriptyline  (ELAVIL ) 25 MG tablet Take 1 tablet (25 mg total) by mouth at bedtime. NEEDS APPOINTMENT FOR FURTHER REFILLS.   Ascorbic Acid (VITAMIN C) 1000 MG tablet Take 1,000 mg by mouth daily.   atorvastatin  (LIPITOR) 80 MG tablet Take 1 tablet (80 mg total) by mouth daily.   Berberine Chloride (BERBERINE HCI)  500 MG CAPS Take by mouth.   Biotin 5000 MCG TABS Take 1 tablet by mouth daily.   BL GINKGO  BILOBA PO Take 60 mg by mouth.   cetirizine  (ZYRTEC ) 10 MG tablet TAKE 1 TABLET BY MOUTH EVERY DAY   co-enzyme Q-10 30 MG capsule Take 100 mg by mouth 3 (three) times daily.   ipratropium (ATROVENT ) 0.06 % nasal spray PLACE 2 SPRAYS INTO BOTH NOSTRILS 2 (TWO) TIMES DAILY   LORazepam  (ATIVAN ) 1 MG tablet Take 1 tablet (1 mg total) by mouth daily as needed for anxiety.   Magnesium 200 MG TABS Take 1 tablet by mouth at bedtime.   meloxicam  (MOBIC ) 15 MG tablet Take 15 mg by mouth daily.   omeprazole (PRILOSEC) 10 MG capsule Take 10 mg by mouth daily.    Probiotic Product (PROBIOTIC PO) Take by mouth.   SUPER B COMPLEX/C PO Take 500 mg by mouth.   temazepam  (RESTORIL ) 15 MG capsule Take 1-2 capsules (15-30 mg total) by mouth at bedtime as needed for sleep.   tirzepatide  (MOUNJARO ) 2.5 MG/0.5ML Pen Inject 2.5 mg into the skin once a week.   VITAMIN D-VITAMIN K PO Take by mouth.   [DISCONTINUED] atorvastatin  (LIPITOR) 40 MG tablet Take 1 tablet (40 mg total) by mouth daily.    Physical Exam:    VS:  BP 124/76   Pulse 84   Ht 5\' 10"  (1.778 m)   Wt 256 lb 3.2 oz (116.2 kg)   SpO2 97%   BMI 36.76 kg/m    Wt Readings from Last 3 Encounters:  12/24/23 256 lb 3.2 oz (116.2 kg)  12/14/23 257 lb (116.6 kg)  11/09/23 252 lb 0.2 oz (114.3 kg)    GEN: Well nourished, well developed in no acute distress NECK: No JVD; No carotid bruits CARDIAC: RRR, no murmurs, rubs, gallops RESPIRATORY:  Clear to auscultation without rales, wheezing or rhonchi  ABDOMEN: Soft, non-tender, non-distended EXTREMITIES:  No edema; No acute deformity     Asessement and Plan:.    Palpitations: Cardiac monitor in 10/2022 showed an average heart rate of 79 bpm, ranging from 43 to 287 bpm.  Predominant underlying rhythm was sinus rhythm.  First-degree AV block was present and he had 1 run of ventricular tachycardia lasting 4 beats with max rate of 118 bpm, he had 33 runs of supraventricular tachycardia, run with a fast  interval lasting 11 beats with a max rate of 187 bpm, longest lasting 19 beats with an average of 154 bpm.  He had frequent PACs at 7.7%.  There was no evidence of atrial fibrillation or flutter.  Echocardiogram on 10/27/2022 indicated LVEF of 60 to 65%, no RWMA, diastolic providers were normal, there were no significant valvular abnormalities.  Previously recommended the patient started on Toprol, patient deferred. Today patient reports that his palpitations improved, he did not receive his cardiac monitor, deferred repeat monitor at this time given improvement in symptoms.  Coronary calcifications: Coronary calcium  score of 204 on 06/17/2023, this was 60 percentile for age, race and sex matched control.  He was started on aspirin  81 mg daily. Stable with no anginal symptoms. No indication for ischemic evaluation.  Heart healthy diet and regular cardiovascular exercise encouraged.    Hyperlipidemia: Last lipid profile on 10/11/2023 indicated total cholesterol 165, HDL 37, triglycerides 283 and LDL 81. Increase atorvastatin  to 80 mg daily. LDL goal less then 55 given history of diabetes.  Repeat fasting lipid profile and LFTs in 8 weeks.  Abdominal  bruit: Abdominal ultrasound on 06/17/2023 indicated a 3 cm proximal abdominal aortic aneurysm with recommended follow-up in 3 years.  Type 2 diabetes mellitus: Last hemoglobin A1c 10.2 on 10/11/2023.  Monitor managed per PCP.  Sleep-disordered breathing: Patient previously reported increased morning at night, declines sleep study.    Disposition: F/u with Dr. Audery Blazing in 6 months.   Signed, Tonnya Garbett D Kaylub Detienne, NP

## 2023-12-24 ENCOUNTER — Encounter: Payer: Self-pay | Admitting: Cardiology

## 2023-12-24 ENCOUNTER — Ambulatory Visit: Attending: Cardiology | Admitting: Cardiology

## 2023-12-24 VITALS — BP 124/76 | HR 84 | Ht 70.0 in | Wt 256.2 lb

## 2023-12-24 DIAGNOSIS — R002 Palpitations: Secondary | ICD-10-CM | POA: Diagnosis not present

## 2023-12-24 DIAGNOSIS — E782 Mixed hyperlipidemia: Secondary | ICD-10-CM | POA: Diagnosis not present

## 2023-12-24 DIAGNOSIS — E1149 Type 2 diabetes mellitus with other diabetic neurological complication: Secondary | ICD-10-CM | POA: Diagnosis not present

## 2023-12-24 DIAGNOSIS — R0989 Other specified symptoms and signs involving the circulatory and respiratory systems: Secondary | ICD-10-CM | POA: Diagnosis not present

## 2023-12-24 DIAGNOSIS — G4730 Sleep apnea, unspecified: Secondary | ICD-10-CM

## 2023-12-24 DIAGNOSIS — R931 Abnormal findings on diagnostic imaging of heart and coronary circulation: Secondary | ICD-10-CM

## 2023-12-24 MED ORDER — ATORVASTATIN CALCIUM 80 MG PO TABS: 80.0000 mg | ORAL_TABLET | Freq: Every day | ORAL | 3 refills | Status: DC

## 2023-12-24 NOTE — Patient Instructions (Signed)
 Medication Instructions:  Increase atorvastatin  to 80 mg once a day *If you need a refill on your cardiac medications before your next appointment, please call your pharmacy*  Lab Work: In 2 month we are going to need fasting lipid panel and lft's If you have labs (blood work) drawn today and your tests are completely normal, you will receive your results only by: MyChart Message (if you have MyChart) OR A paper copy in the mail If you have any lab test that is abnormal or we need to change your treatment, we will call you to review the results.  Testing/Procedures: No testing  Follow-Up: At [facility], you and your health needs are our priority.  As part of our continuing mission to provide you with exceptional heart care, our providers are all part of one team.  This team includes your primary Cardiologist (physician) and Advanced Practice Providers or APPs (Physician Assistants and Nurse Practitioners) who all work together to provide you with the care you need, when you need it.  Your next appointment:   6 month(s)  Provider:   Alexandria Angel, MD     We recommend signing up for the patient portal called "MyChart".  Sign up information is provided on this After Visit Summary.  MyChart is used to connect with patients for Virtual Visits (Telemedicine).  Patients are able to view lab/test results, encounter notes, upcoming appointments, etc.  Non-urgent messages can be sent to your provider as well.   To learn more about what you can do with MyChart, go to [email or web address].   Other Instructions:   1st Floor: - Lobby - Registration  - Pharmacy  - Lab - Cafe  2nd Floor: - PV Lab - Diagnostic Testing (echo, CT, nuclear med)  3rd Floor: - Vacant  4th Floor: - TCTS (cardiothoracic surgery) - AFib Clinic - Structural Heart Clinic - Vascular Surgery  - Vascular Ultrasound  5th Floor: - [facility] Cardiology (general and EP) - Clinical Pharmacy for  coumadin, hypertension, lipid, weight-loss medications, and med management appointments    Valet parking services will be available as well.

## 2023-12-25 ENCOUNTER — Encounter: Payer: Self-pay | Admitting: Cardiology

## 2023-12-27 ENCOUNTER — Other Ambulatory Visit: Payer: Self-pay | Admitting: Medical-Surgical

## 2023-12-31 ENCOUNTER — Telehealth: Payer: Self-pay | Admitting: Medical-Surgical

## 2023-12-31 NOTE — Telephone Encounter (Signed)
 Copied from CRM [id number]. Topic: Clinical - Medical Advice >> Dec 31, 2023 10:59 AM [name] wrote: Reason for CRM: tirzepatide  (MOUNJARO ) 2.5 MG/0.5ML Pen-Patient stated medication is 265.00 can not afford this. Needs cal back on alternative medication that is affordable. [phone]

## 2024-01-03 ENCOUNTER — Other Ambulatory Visit (HOSPITAL_COMMUNITY): Payer: Self-pay

## 2024-01-03 ENCOUNTER — Telehealth: Payer: Self-pay

## 2024-01-03 NOTE — Telephone Encounter (Signed)
 Pharmacy Patient Advocate Encounter   Received notification from Patient Pharmacy that prior authorization for Amitriptyline  25 is required/requested.   Insurance verification completed.   The patient is insured through [employer] .   Per test claim: PA required; PA submitted to above mentioned insurance via CoverMyMeds Key/confirmation #/EOC [id number] Status is pending

## 2024-01-03 NOTE — Telephone Encounter (Signed)
Notified via Mychart message.

## 2024-01-03 NOTE — Telephone Encounter (Signed)
 Pharmacy Patient Advocate Encounter  Received notification from [employer] that Prior Authorization for Amitriptyline  25 has been APPROVED from 09/08/23 to 09/06/24. Unable to obtain price due to refill too soon rejection, last fill date 01/03/24 next available fill date 03/11/24   PA #/Case ID/Reference #: [id number]

## 2024-01-05 NOTE — Telephone Encounter (Signed)
 Left message for a return call

## 2024-01-15 ENCOUNTER — Other Ambulatory Visit: Payer: Self-pay | Admitting: Medical-Surgical

## 2024-01-25 ENCOUNTER — Other Ambulatory Visit: Payer: Self-pay | Admitting: Medical-Surgical

## 2024-01-25 DIAGNOSIS — E1149 Type 2 diabetes mellitus with other diabetic neurological complication: Secondary | ICD-10-CM

## 2024-02-04 ENCOUNTER — Ambulatory Visit (INDEPENDENT_AMBULATORY_CARE_PROVIDER_SITE_OTHER): Admitting: Medical-Surgical

## 2024-02-04 ENCOUNTER — Encounter: Payer: Self-pay | Admitting: Medical-Surgical

## 2024-02-04 VITALS — BP 125/68 | HR 84 | Resp 20 | Ht 70.0 in | Wt 261.0 lb

## 2024-02-04 DIAGNOSIS — E1149 Type 2 diabetes mellitus with other diabetic neurological complication: Secondary | ICD-10-CM | POA: Diagnosis not present

## 2024-02-04 DIAGNOSIS — L02214 Cutaneous abscess of groin: Secondary | ICD-10-CM | POA: Diagnosis not present

## 2024-02-04 DIAGNOSIS — Z7985 Long-term (current) use of injectable non-insulin antidiabetic drugs: Secondary | ICD-10-CM

## 2024-02-04 LAB — POCT GLYCOSYLATED HEMOGLOBIN (HGB A1C)
HbA1c, POC (controlled diabetic range): 8.4 % — AB (ref 0.0–7.0)
Hemoglobin A1C: 8.4 % — AB (ref 4.0–5.6)

## 2024-02-04 MED ORDER — DOXYCYCLINE HYCLATE 100 MG PO TABS: 100.0000 mg | ORAL_TABLET | Freq: Two times a day (BID) | ORAL | 0 refills | Status: AC

## 2024-02-04 NOTE — Progress Notes (Signed)
        Established patient visit  History, exam, impression, and plan:  1. Groin abscess (Primary) Pleasant 68 year old male presenting today with complaints of a large lump behind his scrotum that he found this morning when he was in the shower. Notes that the lump is tender to touch. No fevers, chills, myalgias. No recent sexual activity. Reports he shaves his body hair and thought it might be an ingrown hair. Chaperone present for exam. There is a firm lump just behind the scrotum on the left side that is erythematous, tender, approximately 2cm round and 0.5cm elevation. No fluctuance noted. Deferring I&D today. Treating with Doxycycline  100mg  twice daily for 7 days. Plan to return for further evaluation in 1 week.   2. 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(HCC) Has been taking Berberine three times daily with meals. Just recently started on Mounjaro  2.5mg  weekly. Prior A1c 10.2%. Recheck today 8.4% showing improvement. Continue Berberine. Increase Mounjaro  to 5mg  weekly.  - POCT HgB A1C  Procedures performed this visit: None.  Return in about 1 week (around 02/11/2024) for abscess follow up.  __________________________________ Maryl Snook, DNP, APRN, FNP-BC Primary Care and Sports Medicine [facility] [place]

## 2024-02-04 NOTE — Patient Instructions (Signed)
 Skin Abscess  A skin abscess is an infected area on or under your skin. It contains pus and other material. An abscess may also be called a furuncle, carbuncle, or boil. It is often the result of an infection caused by bacteria. An abscess can occur in or on almost any part of your body. Sometimes, an abscess may break open (rupture) on its own. In most cases, it will keep getting worse unless it is treated. An abscess can cause pain and make you feel ill. An untreated abscess can cause infection to spread to other parts of your body or your bloodstream. The abscess may need to be drained. You may also need to take antibiotics. What are the causes? An abscess occurs when germs, like bacteria, pass through your skin and cause an infection. This may be caused by: A scrape or cut on your skin. A puncture wound through your skin, such as a needle injection or insect bite. Blocked oil or sweat glands. Blocked and infected hair follicles. A fluid-filled sac that forms beneath your skin (sebaceous cyst) and becomes infected. What increases the risk? You may be more likely to develop an abscess if: You have problems with blood circulation, or you have a weak body defense system (immune system). You have diabetes. You have dry and irritated skin. You get injections often or use IV drugs. You have a foreign body in a wound, such as a splinter. You smoke or use tobacco products. What are the signs or symptoms? Symptoms of this condition include: A painful, firm bump under the skin. A bump with pus at the top. This may break through the skin and drain. Other symptoms include: Redness and swelling around the abscess. Warmth or tenderness. Swelling of the lymph nodes (glands) near the abscess. A sore on the skin. How is this diagnosed? This condition may be diagnosed based on a physical exam and your medical history. You may also have tests done, such as: A test of a sample of pus. This may be done  to find what is causing the infection. Blood tests. Imaging tests, such as an ultrasound, CT scan, or MRI. How is this treated? A small abscess that drains on its own may not need to be treated. Treatment for larger abscesses may include: Moist heat or a heat pack applied to the area a few times a day. Incision and drainage. This is a procedure to drain the abscess. Antibiotics. For a severe abscess, you may first get antibiotics through an IV and then change to antibiotics by mouth. Follow these instructions at home: Medicines Take over-the-counter and prescription medicines only as told by your provider. If you were prescribed antibiotics, take them as told by your provider. Do not stop using the antibiotic even if you start to feel better. Abscess care  If you have an abscess that has not drained, apply heat to the affected area. Use the heat source that your provider recommends, such as a moist heat pack or a heating pad. Place a towel between your skin and the heat source. Leave the heat on for 20-30 minutes at a time. If your skin turns bright red, remove the heat right away to prevent burns. The risk of burns is higher if you cannot feel pain, heat, or cold. Follow instructions from your provider about how to take care of your abscess. Make sure you: Cover the abscess with a bandage (dressing). Wash your hands with soap and water for at least 20 seconds before  and after you change the dressing or gauze. If soap and water are not available, use hand sanitizer. Change your dressing or gauze as told by your provider. Check your abscess every day for signs of an infection that is getting worse. Check for: More redness, swelling, pain, or tenderness. More fluid or blood. Warmth. More pus or a worse smell. General instructions To avoid spreading the infection: Do not share personal care items, towels, or hot tubs with others. Avoid making skin contact with other people. Be careful  when getting rid of used dressings, wound packing, or any drainage from the abscess. Do not use any products that contain nicotine or tobacco. These products include cigarettes, chewing tobacco, and vaping devices, such as e-cigarettes. If you need help quitting, ask your provider. Do not use any creams, ointments, or liquids unless you have been told to by your provider. Contact a health care provider if: You see redness that spreads quickly or red streaks on your skin spreading away from the abscess. You have any signs of worse infection at the abscess. You vomit every time you eat or drink. You have a fever, chills, or muscle aches. The cyst or abscess returns. Get help right away if: You have severe pain. You make less pee (urine) than normal. This information is not intended to replace advice given to you by your health care provider. Make sure you discuss any questions you have with your health care provider. Document Revised: 04/08/2022 Document Reviewed: 04/08/2022 Elsevier Patient Education  2024 [employer].

## 2024-02-06 MED ORDER — TIRZEPATIDE 5 MG/0.5ML ~~LOC~~ SOAJ: 5.0000 mg | SUBCUTANEOUS | 1 refills | Status: DC

## 2024-02-11 ENCOUNTER — Ambulatory Visit: Admitting: Medical-Surgical

## 2024-03-23 ENCOUNTER — Telehealth: Payer: Self-pay | Admitting: Medical-Surgical

## 2024-03-23 NOTE — Telephone Encounter (Signed)
 Patient dropped off document DMV, to be filled out by provider. Patient requested to send it back via Call Patient to pick up within 7-days. Document is located in providers tray at front office.Please advise at [facility] [phone]

## 2024-04-09 ENCOUNTER — Other Ambulatory Visit: Payer: Self-pay | Admitting: Medical-Surgical

## 2024-04-17 ENCOUNTER — Other Ambulatory Visit: Payer: Self-pay | Admitting: Medical-Surgical

## 2024-04-17 DIAGNOSIS — F5104 Psychophysiologic insomnia: Secondary | ICD-10-CM

## 2024-04-17 NOTE — Telephone Encounter (Signed)
 Last filled 10/11/2023  Last office visit 02/04/2024  No future appointments

## 2024-05-05 NOTE — Telephone Encounter (Signed)
 Patient came into office to get form on 05/05/24, thanks.Danny Morgan

## 2024-05-09 ENCOUNTER — Encounter: Payer: Self-pay | Admitting: Sports Medicine

## 2024-06-01 ENCOUNTER — Encounter: Payer: Self-pay | Admitting: Medical-Surgical

## 2024-06-01 ENCOUNTER — Ambulatory Visit (INDEPENDENT_AMBULATORY_CARE_PROVIDER_SITE_OTHER): Admitting: Medical-Surgical

## 2024-06-01 VITALS — BP 133/74 | HR 73 | Resp 20 | Ht 70.0 in | Wt 257.0 lb

## 2024-06-01 DIAGNOSIS — E782 Mixed hyperlipidemia: Secondary | ICD-10-CM

## 2024-06-01 DIAGNOSIS — E1149 Type 2 diabetes mellitus with other diabetic neurological complication: Secondary | ICD-10-CM | POA: Diagnosis not present

## 2024-06-01 DIAGNOSIS — Z794 Long term (current) use of insulin: Secondary | ICD-10-CM

## 2024-06-01 DIAGNOSIS — F5104 Psychophysiologic insomnia: Secondary | ICD-10-CM | POA: Diagnosis not present

## 2024-06-01 LAB — POCT GLYCOSYLATED HEMOGLOBIN (HGB A1C)
HbA1c, POC (controlled diabetic range): 11.3 % — AB (ref 0.0–7.0)
Hemoglobin A1C: 11.3 % — AB (ref 4.0–5.6)

## 2024-06-01 MED ORDER — VERIFINE INSULIN PEN NEEDLE 32G X 6 MM MISC: 11 refills | Status: AC

## 2024-06-01 MED ORDER — LANTUS 100 UNIT/ML ~~LOC~~ SOLN: 10.0000 [IU] | Freq: Every day | SUBCUTANEOUS | 5 refills | Status: AC

## 2024-06-01 MED ORDER — AMITRIPTYLINE HCL 25 MG PO TABS: 25.0000 mg | ORAL_TABLET | Freq: Every day | ORAL | 3 refills | Status: AC

## 2024-06-01 NOTE — Progress Notes (Signed)
 Established patient visit   History of Present Illness   Discussed the use of AI scribe software for clinical note transcription with the patient, who gave verbal consent to proceed.  History of Present Illness   Danny Morgan. Danny Morgan. Danny Morgan is a 68 year old male with diabetes and a back injury who presents with worsening leg weakness and elevated blood sugar levels.  Lower extremity weakness and back pain - Sustained a back injury on February 26, 2023, resulting in compression of the L4 and L5 disks - Experiences significant bilateral leg weakness with approximately 50% loss of leg strength - Ongoing back and leg pain - Reduced work hours due to physical limitations, resulting in worsening financial strain  Hyperglycemia and diabetes management - Hemoglobin checked in June 8.4% - Elevated blood glucose complicated by stress, pain, and poor sleep - Takes berberine with each meal for glycemic control, dislikes the taste and has ordered a liquid formulation with cinnamon - Not taking Mounjaro  due to cost - Dietary modifications include eating sprouted grain bread and avoiding seed oils, with occasional fast food consumption  Sleep disturbance - Ran out of amitriptyline , which is helpful for sleep - Poor sleep attributed to pain and stress  Dyslipidemia management - Takes atorvastatin  for cholesterol management without issues  Psychosocial stressors - Experiencing significant stress due to family health issues and financial concerns - Mental health affected by ongoing stressors     Physical Exam   Physical Exam Vitals and nursing note reviewed.  Constitutional:      General: He is not in acute distress.    Appearance: Normal appearance.  HENT:     Head: Normocephalic and atraumatic.  Cardiovascular:     Rate and Rhythm: Normal rate and regular rhythm.     Pulses: Normal pulses.     Heart sounds: Normal heart sounds. No murmur heard.    No friction rub. No gallop.   Pulmonary:     Effort: Pulmonary effort is normal. No respiratory distress.     Breath sounds: Normal breath sounds.  Skin:    General: Skin is warm and dry.  Neurological:     Mental Status: He is alert and oriented to person, place, and time.  Psychiatric:        Mood and Affect: Mood normal.        Behavior: Behavior normal.        Thought Content: Thought content normal.        Judgment: Judgment normal.    Assessment & Plan   Problem List Items Addressed This Visit       Endocrine   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(HCC) - Primary   Relevant Medications   insulin  glargine (LANTUS ) 100 UNIT/ML injection   Other Relevant Orders   POCT HgB A1C (Completed)     Other   Hyperlipidemia   Relevant Orders   Lipid panel   CMP14+EGFR   Morbid obesity (HCC)   Relevant Medications   insulin  glargine (LANTUS ) 100 UNIT/ML injection   Lumbar radiculopathy with bilateral leg weakness and chronic low back pain Chronic pain and weakness due to likely L4-L5 disc compression. Avoiding symptom-masking injections.  Current workup and management being handled by [employer]. - Discussed potential legal action for work-related injury. - Restarting amitriptyline  25 mg nightly.  Follow up   Return in about 3 months (around 08/31/2024) for DM/HTN/HLD follow up. __________________________________ Danny FREDRIK Palin, DNP, APRN, FNP-BC Primary  Care and Sports Medicine [facility]

## 2024-06-01 NOTE — Assessment & Plan Note (Signed)
 History of morbid obesity with BMI of 36.88 and multiple comorbidities.  He has been unable to exercise due to his back injury and has been making changes to his diet however has not been able to lose and maintain his weight loss.  Unable to afford Mounjaro /Ozempic .  Insurance not covering weight loss medications. - Continue to work on dietary modifications including portion control. - Resume physical activity as tolerated once his injury is resolved/addressed.

## 2024-06-01 NOTE — Assessment & Plan Note (Signed)
 Managed with amitriptyline  25 mg nightly without side effects, requires refill. - Refill amitriptyline  for bedtime use.

## 2024-06-01 NOTE — Assessment & Plan Note (Signed)
 Managed with atorvastatin  80 mg daily. Discussed importance of cholesterol management due to cardiovascular risk. - Continue atorvastatin  therapy. - Checking lipids.

## 2024-06-01 NOTE — Assessment & Plan Note (Signed)
 A1c increased to 11.3, indicating poor control. Current berberine treatment insufficient. - Initiate Lantus  insulin  at 10 units daily with titration to a max of 40 units daily. - Continue berberine. - Check blood sugar regularly. - Recheck A1c in 3 months.

## 2024-06-02 ENCOUNTER — Ambulatory Visit: Payer: Self-pay | Admitting: Medical-Surgical

## 2024-06-02 LAB — CMP14+EGFR
ALT: 36 IU/L (ref 0–44)
AST: 30 IU/L (ref 0–40)
Albumin: 4.2 g/dL (ref 3.9–4.9)
Alkaline Phosphatase: 108 IU/L (ref 47–123)
BUN/Creatinine Ratio: 21 (ref 10–24)
BUN: 17 mg/dL (ref 8–27)
Bilirubin Total: 0.5 mg/dL (ref 0.0–1.2)
CO2: 20 mmol/L (ref 20–29)
Calcium: 9.1 mg/dL (ref 8.6–10.2)
Chloride: 99 mmol/L (ref 96–106)
Creatinine, Ser: 0.81 mg/dL (ref 0.76–1.27)
Globulin, Total: 2.5 g/dL (ref 1.5–4.5)
Glucose: 276 mg/dL — ABNORMAL HIGH (ref 70–99)
Potassium: 4.5 mmol/L (ref 3.5–5.2)
Sodium: 134 mmol/L (ref 134–144)
Total Protein: 6.7 g/dL (ref 6.0–8.5)
eGFR: 95 mL/min/1.73 (ref >59)

## 2024-06-02 LAB — LIPID PANEL
Chol/HDL Ratio: 2.5 ratio (ref 0.0–5.0)
Cholesterol, Total: 93 mg/dL — ABNORMAL LOW (ref 100–199)
HDL: 37 mg/dL — ABNORMAL LOW (ref >39)
LDL Chol Calc (NIH): 43 mg/dL (ref 0–99)
Triglycerides: 55 mg/dL (ref 0–149)
VLDL Cholesterol Cal: 13 mg/dL (ref 5–40)

## 2024-06-13 ENCOUNTER — Other Ambulatory Visit: Payer: Self-pay | Admitting: Cardiology

## 2024-06-13 DIAGNOSIS — E782 Mixed hyperlipidemia: Secondary | ICD-10-CM

## 2024-08-17 NOTE — Progress Notes (Signed)
 [name]. [name]                                          MRN: [id number]   08/17/2024   The [facility] Quality Team Specialist reviewed this patient medical record for the purposes of chart review for care gap closure. The following were reviewed: chart review for care gap closure-glycemic status assessment.    [facility] Quality Team

## 2024-08-22 ENCOUNTER — Telehealth (HOSPITAL_BASED_OUTPATIENT_CLINIC_OR_DEPARTMENT_OTHER): Payer: Self-pay | Admitting: *Deleted

## 2024-08-22 NOTE — Telephone Encounter (Signed)
 Office calling to state anesthesia will be general. Please advise.

## 2024-08-22 NOTE — Telephone Encounter (Signed)
 Left message to call back to schedule tele pre op appt.

## 2024-08-22 NOTE — Telephone Encounter (Signed)
° °  Name: Danny Morgan. Danny Morgan.  DOB: May 26, 1956  MRN: [id number]  Primary Cardiologist: Redell Shallow, MD  Chart reviewed as part of pre-operative protocol coverage. Because of Danny Morgan. Danny Jr.'s past medical history and time since last visit, he will require a follow-up telephone visit in order to better assess preoperative cardiovascular risk.  Pre-op covering staff: - Please schedule appointment and call patient to inform them. If patient already had an upcoming appointment within acceptable timeframe, please add pre-op clearance to the appointment notes so provider is aware. - Please contact requesting surgeon's office via preferred method (i.e, phone, fax) to inform them of need for appointment prior to surgery.  No medications indicated as needing held.  I also confirmed the patient resides in the state of [place] . As per [facility] Medical Board telemedicine laws, the patient must reside in the state in which the provider is licensed.    Danny LOISE Fabry, PA-C  08/22/2024, 12:56 PM

## 2024-08-22 NOTE — Telephone Encounter (Signed)
° °  Pre-operative Risk Assessment    Patient Name: Danny Morgan. [name]  DOB: 07-Nov-1955 MRN: [id number]   Date of last office visit: 12/24/23 KATLYN WEST, NP Date of next office visit: NONE   LEFT MESSAGE FOR SURGERY SCHEDULER LORI STONE TO PLEASE CALL BACK AND PROVIDE TYPE OF ANESTHESIA TO BE USED.    Request for Surgical Clearance    Procedure:  LUMBAR DECOMPRESSION  Date of Surgery:  Clearance TBD                                Surgeon:  DR. REYES BILLING Surgeon's Group or Practice Name:  [facility] Phone number:  [phone] KATHERYN DUSTMAN Fax number:  [phone]   Type of Clearance Requested:   - Medical    Type of Anesthesia:  Not Indicated; LEFT MESSAGE FOR SURGERY SCHEDULER TO OBTAIN WHAT TYPE OF ANESTHESIA TO BE USED.    Additional requests/questions:    Signed, Vada Yellen   08/22/2024, 11:06 AM

## 2024-08-23 NOTE — Telephone Encounter (Signed)
 Luke DASEN, scheduling team: Walterine Ivanoff, Good Morning! Mr Shin sent a message via mychart to schedule his preop appointment. I tried to call him but he didn't answer so I scheduled him for Friday at 10:00 am and sent him a mychart message to let him know.    I will try to reach the pt by phone to confirm appt as well as med rec to be done in prep for the tele appt.

## 2024-08-23 NOTE — Telephone Encounter (Signed)
 PT is calling to return call to nurse

## 2024-08-23 NOTE — Telephone Encounter (Signed)
 I also left a message for the pt that he needs to call back to confirm the tele preop appt 12/19 @ 10 am as well as review medication list in prep for tele appt. I will also send the pt a my chart message.

## 2024-08-23 NOTE — Telephone Encounter (Signed)
 Attempted to contact pt to confirm med list prior to tele appt and to get consent. LVMFCB

## 2024-08-24 ENCOUNTER — Other Ambulatory Visit: Payer: Self-pay

## 2024-08-24 ENCOUNTER — Ambulatory Visit: Admitting: Medical-Surgical

## 2024-08-24 ENCOUNTER — Encounter: Payer: Self-pay | Admitting: Medical-Surgical

## 2024-08-24 VITALS — BP 127/75 | HR 80 | Resp 20 | Ht 70.0 in | Wt 270.0 lb

## 2024-08-24 DIAGNOSIS — F4323 Adjustment disorder with mixed anxiety and depressed mood: Secondary | ICD-10-CM | POA: Diagnosis not present

## 2024-08-24 DIAGNOSIS — E1149 Type 2 diabetes mellitus with other diabetic neurological complication: Secondary | ICD-10-CM

## 2024-08-24 DIAGNOSIS — Z01818 Encounter for other preprocedural examination: Secondary | ICD-10-CM

## 2024-08-24 MED ORDER — CETIRIZINE HCL 10 MG PO TABS: 10.0000 mg | ORAL_TABLET | Freq: Every day | ORAL | 1 refills | Status: AC

## 2024-08-24 NOTE — Progress Notes (Addendum)
 Established Patient Office Visit  Subjective   Patient ID: Danny Morgan. Danny Raddle., male    DOB: 17-Feb-1956  Age: 68 y.o. MRN: [id number]  Chief Complaint  Patient presents with   Form Completion    Surgical Clearance Form    HPI  68 year old male presents for 3 month follow up on the following chronic diseases  Type 2 Diabetes Patient is currently being treated with Lantus  70 units nightly and berberine 500 mg capsules daily. Patient is no longer taking the Mounjaro  due to cost. Denies having any side effects to his medication. He checks his blood sugars about once a week. He last checked a couple of days ago and the reading was about 200.   Surgical Clearance  Patient also needs surgical clearance form completed for back surgery. Back pain in being managed with workers comp and [employer]. Surgery date TBD pending surgical clearance from PCP and Cardiology.  Adjustment disorder with mixed anxiety and depressed mood  Currently taking Elavil  25 mg at bedtime, temazepam  15 mg 1-2 tablets as needed, and magnesium 200 mg nightly. Patient continues to have issues with anxiety. Associating symptoms to situational stressors. Patient does not want to make any changes to medication at this time.  Patient Active Problem List   Diagnosis Date Noted   Bad odor of urine 10/11/2023   Eczema 12/15/2021   Lumbar spinal stenosis 08/28/2021   Impingement syndrome, shoulder, left 08/28/2021   Epistaxis 11/12/2020   Erectile dysfunction associated with type 2 diabetes mellitus (HCC) 11/12/2020   Adjustment disorder with mixed anxiety and depressed mood 09/17/2020   Insomnia 09/17/2020   Neuropathy of both feet 09/17/2020   Palpitations 09/17/2020   Pilar cyst 09/17/2020   Stress due to family tension 09/17/2020   Tubular adenoma of colon 09/17/2020   DOE (dyspnea on exertion) 06/24/2020   Chest discomfort 06/24/2020   Morbid obesity (HCC) 06/24/2020   Cardiac murmur 06/24/2020    Allergy    Anxiety    Cataract    Colon polyps    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(HCC)    GERD (gastroesophageal reflux disease)    Hyperlipidemia    Past Medical History:  Diagnosis Date   Adjustment disorder with mixed anxiety and depressed mood 09/17/2020   Allergy    Angio-edema    Anxiety    Body mass index (BMI) 39.0-39.9, adult 09/17/2020   Cardiac murmur 06/24/2020   Cataract    beginning stage in rt. eye   Chest discomfort 06/24/2020   Colon polyps    Diabetes mellitus without complication (HCC)    DOE (dyspnea on exertion) 06/24/2020   Eczema    Epistaxis 11/12/2020   Erectile dysfunction due to type 2 diabetes mellitus (HCC) 11/12/2020   GERD (gastroesophageal reflux disease)    Hyperlipidemia    states cholesterol normal states being given to protect kidneys   Impingement syndrome, shoulder, left 08/28/2021   Insomnia 09/17/2020   Lumbar spinal stenosis 08/28/2021   Morbid obesity (HCC) 06/24/2020   Neuropathy of both feet 09/17/2020   Palpitations 09/17/2020   Pilar cyst 09/17/2020   Stress due to family tension 09/17/2020   Tubular adenoma of colon 09/17/2020   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[facility])    Past Surgical History:  Procedure Laterality Date   COLONOSCOPY W/ BIOPSIES AND POLYPECTOMY     RETINAL DETACHMENT SURGERY     age 58   WISDOM TOOTH EXTRACTION  Social History[1] Social History   Socioeconomic History   Marital status: Married    Spouse name: Gustav   Number of children: 1   Years of education: 12   Highest education level: 12th grade  Occupational History   Occupation: Dye optician, dispensing    Comment: Full time  Tobacco Use   Smoking status: Former    Current packs/day: 0.00    Average packs/day: 0.5 packs/day for 5.8 years (2.9 ttl pk-yrs)    Types: Cigarettes    Start date: 02/12/1972    Quit date: 11/13/1977    Years since quitting: 46.8   Smokeless tobacco: Never   Tobacco  comments:    quit at age 79  Vaping Use   Vaping status: Never Used  Substance and Sexual Activity   Alcohol use: Yes    Comment: Occasionally   Drug use: No   Sexual activity: Yes    Partners: Female    Comment: married  Other Topics Concern   Not on file  Social History Narrative   Lives with his family, (son and 2 grandchildren). 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   Social Drivers of Health   Tobacco Use: Medium Risk (08/24/2024)   Patient History    Smoking Tobacco Use: Former    Smokeless Tobacco Use: Never    Passive Exposure: Not on file  Financial Resource Strain: Low Risk (12/14/2023)   Overall Financial Resource Strain (CARDIA)    Difficulty of Paying Living Expenses: Not hard at all  Food Insecurity: No Food Insecurity (12/14/2023)   Hunger Vital Sign    Worried About Running Out of Food in the Last Year: Never true    Ran Out of Food in the Last Year: Never true  Transportation Needs: No Transportation Needs (12/14/2023)   PRAPARE - Administrator, Civil Service (Medical): No    Lack of Transportation (Non-Medical): No  Physical Activity: Sufficiently Active (12/14/2023)   Exercise Vital Sign    Days of Exercise per Week: 4 days    Minutes of Exercise per Session: 60 min  Stress: Stress Concern Present (12/14/2023)   [employer] of Occupational Health - Occupational Stress Questionnaire    Feeling of Stress : To some extent  Social Connections: Moderately Isolated (12/14/2023)   Social Connection and Isolation Panel    Frequency of Communication with Friends and Family: More than three times a week    Frequency of Social Gatherings with Friends and Family: More than three times a week    Attends Religious Services: Never    Database Administrator or Organizations: No    Attends Banker Meetings: Never    Marital Status: Married  Catering Manager Violence: Not At Risk (12/14/2023)   Humiliation, Afraid, Rape, and Kick questionnaire    Fear  of Current or Ex-Partner: No    Emotionally Abused: No    Physically Abused: No    Sexually Abused: No  Depression (PHQ2-9): Low Risk (08/24/2024)   Depression (PHQ2-9)    PHQ-2 Score: 3  Alcohol Screen: Low Risk (12/14/2023)   Alcohol Screen    Last Alcohol Screening Score (AUDIT): 1  Housing: Low Risk (12/14/2023)   Housing Stability Vital Sign    Unable to Pay for Housing in the Last Year: No    Number of Times Moved in the Last Year: 0    Homeless in the Last Year: No  Utilities: Not At Risk (12/14/2023)   AHC Utilities    Threatened  with loss of utilities: No  Health Literacy: Adequate Health Literacy (12/14/2023)   B1300 Health Literacy    Frequency of need for help with medical instructions: Never   Family Status  Relation Name Status   Mother  Deceased at age 61       gangrene in colon had colostomy   Father  Deceased   Neg Hx  (Not Specified)  No partnership data on file   Family History  Problem Relation Age of Onset   Parkinson's disease Father    Colon cancer Neg Hx    Stomach cancer Neg Hx    Pancreatic cancer Neg Hx    Esophageal cancer Neg Hx    Rectal cancer Neg Hx    Allergic rhinitis Neg Hx    Asthma Neg Hx    Eczema Neg Hx    Urticaria Neg Hx    Allergies[2]    Review of Systems  Constitutional: Negative.   HENT: Negative.    Eyes: Negative.   Respiratory: Negative.    Cardiovascular: Negative.   Gastrointestinal: Negative.   Genitourinary: Negative.   Musculoskeletal: Negative.   Skin: Negative.   Neurological: Negative.   Endo/Heme/Allergies: Negative.   Psychiatric/Behavioral:  The patient is nervous/anxious.       Objective:     BP 127/75 (BP Location: Left Arm, Cuff Size: Normal)   Pulse 80   Resp 20   Ht 5' 10 (1.778 m)   Wt 122.5 kg   SpO2 98%   BMI 38.74 kg/m  BP Readings from Last 3 Encounters:  08/24/24 127/75  06/01/24 133/74  02/04/24 125/68   Wt Readings from Last 3 Encounters:  08/24/24 122.5 kg  06/01/24 116.6  kg  02/04/24 118.4 kg      Physical Exam Vitals and nursing note reviewed.  Constitutional:      General: He is not in acute distress.    Appearance: Normal appearance.  Cardiovascular:     Rate and Rhythm: Normal rate and regular rhythm.     Pulses: Normal pulses.     Heart sounds: Normal heart sounds.  Pulmonary:     Effort: Pulmonary effort is normal.     Breath sounds: Normal breath sounds.  Neurological:     General: No focal deficit present.     Mental Status: He is alert and oriented to person, place, and time.  Psychiatric:        Mood and Affect: Mood normal.        Behavior: Behavior normal.        Thought Content: Thought content normal.        Judgment: Judgment normal.      No results found for any visits on 08/24/24.  Last CBC Lab Results  Component Value Date   WBC 6.8 10/11/2023   HGB 15.8 10/11/2023   HCT 51.2 (H) 10/11/2023   MCV 85 10/11/2023   MCH 26.2 (L) 10/11/2023   RDW 13.1 10/11/2023   PLT 247 10/11/2023   Last metabolic panel Lab Results  Component Value Date   GLUCOSE 276 (H) 06/01/2024   NA 134 06/01/2024   K 4.5 06/01/2024   CL 99 06/01/2024   CO2 20 06/01/2024   BUN 17 06/01/2024   CREATININE 0.81 06/01/2024   EGFR 95 06/01/2024   CALCIUM  9.1 06/01/2024   PROT 6.7 06/01/2024   ALBUMIN 4.2 06/01/2024   LABGLOB 2.5 06/01/2024   BILITOT 0.5 06/01/2024   ALKPHOS 108 06/01/2024   AST 30 06/01/2024  ALT 36 06/01/2024   Last lipids Lab Results  Component Value Date   CHOL 93 (L) 06/01/2024   HDL 37 (L) 06/01/2024   LDLCALC 43 06/01/2024   TRIG 55 06/01/2024   CHOLHDL 2.5 06/01/2024   Last hemoglobin A1c Lab Results  Component Value Date   HGBA1C 11.3 (A) 06/01/2024   HGBA1C 11.3 (A) 06/01/2024      The ASCVD Risk score (Arnett DK, et al., 2019) failed to calculate for the following reasons:   The valid total cholesterol range is 130 to 320 mg/dL    Assessment & Plan:  1. Type 2 diabetes mellitus with  neurological complications (HCC) (Primary) -Continue with current regimen Lantus  70 units nightly -Will reach out to Pharm D for assistance with medications for diabetes management  2. Pre-operative clearance -Medical clearance on hold until updated A1C -Previous A1C 11.3% -Will contact orthopedic office regarding necessary A1C control for medical clearance  3. Adjustment disorder with mixed anxiety and depressed mood  -Continue with current regimen    Return in about 2 weeks (around 09/07/2024) for Nurse Visit  A1C.    Derrek JINNY Freund, NP [facility]     [1]  Social History Tobacco Use   Smoking status: Former    Current packs/day: 0.00    Average packs/day: 0.5 packs/day for 5.8 years (2.9 ttl pk-yrs)    Types: Cigarettes    Start date: 02/12/1972    Quit date: 11/13/1977    Years since quitting: 46.8   Smokeless tobacco: Never   Tobacco comments:    quit at age 26  Vaping Use   Vaping status: Never Used  Substance Use Topics   Alcohol use: Yes    Comment: Occasionally   Drug use: No  [2]  Allergies Allergen Reactions   Penicillins Shortness Of Breath and Swelling   Cephalexin Diarrhea

## 2024-08-24 NOTE — Telephone Encounter (Signed)
 Pt states he went to my chart to confirm his tele-visit with Barnie ORN on Dec 19th     Patient Consent for Virtual Visit         Danny Morgan. Danny Morgan. has provided verbal consent on 08/24/2024 for a virtual visit (video or telephone).   CONSENT FOR VIRTUAL VISIT FOR:  Danny Morgan. Danny Morgan.   By participating in this virtual visit I agree to the following:  I hereby voluntarily request, consent and authorize [facility] and its employed or contracted physicians, physician assistants, nurse practitioners or other licensed health care professionals (the Practitioner), to provide me with telemedicine health care services (the Services) as deemed necessary by the treating Practitioner. I acknowledge and consent to receive the Services by the Practitioner via telemedicine. I understand that the telemedicine visit will involve communicating with the Practitioner through live audiovisual communication technology and the disclosure of certain medical information by electronic transmission. I acknowledge that I have been given the opportunity to request an in-person assessment or other available alternative prior to the telemedicine visit and am voluntarily participating in the telemedicine visit.  I understand that I have the right to withhold or withdraw my consent to the use of telemedicine in the course of my care at any time, without affecting my right to future care or treatment, and that the Practitioner or I may terminate the telemedicine visit at any time. I understand that I have the right to inspect all information obtained and/or recorded in the course of the telemedicine visit and may receive copies of available information for a reasonable fee.  I understand that some of the potential risks of receiving the Services via telemedicine include:  Delay or interruption in medical evaluation due to technological equipment failure or disruption; Information transmitted may not be  sufficient (e.g. poor resolution of images) to allow for appropriate medical decision making by the Practitioner; and/or  In rare instances, security protocols could fail, causing a breach of personal health information.  Furthermore, I acknowledge that it is my responsibility to provide information about my medical history, conditions and care that is complete and accurate to the best of my ability. I acknowledge that Practitioner's advice, recommendations, and/or decision may be based on factors not within their control, such as incomplete or inaccurate data provided by me or distortions of diagnostic images or specimens that may result from electronic transmissions. I understand that the practice of medicine is not an exact science and that Practitioner makes no warranties or guarantees regarding treatment outcomes. I acknowledge that a copy of this consent can be made available to me via my patient portal [facility] MyChart), or I can request a printed copy by calling the office of  [facility].    I understand that my insurance will be billed for this visit.   I have read or had this consent read to me. I understand the contents of this consent, which adequately explains the benefits and risks of the Services being provided via telemedicine.  I have been provided ample opportunity to ask questions regarding this consent and the Services and have had my questions answered to my satisfaction. I give my informed consent for the services to be provided through the use of telemedicine in my medical care

## 2024-08-24 NOTE — Progress Notes (Signed)
 Medical screening examination/treatment was performed by qualified clinical staff member and as supervising provider I was immediately available for consultation/collaboration. I have reviewed documentation and agree with assessment and plan.  Thayer Ohm, DNP, APRN, FNP-BC [facility]

## 2024-08-25 ENCOUNTER — Ambulatory Visit

## 2024-08-25 DIAGNOSIS — Z01810 Encounter for preprocedural cardiovascular examination: Secondary | ICD-10-CM

## 2024-08-25 NOTE — Progress Notes (Signed)
"  ° °  Virtual Visit via Telephone Note   Because of Danny Stuard. Scholler Jr.'s co-morbid illnesses, he is at least at moderate risk for complications without adequate follow up.  This format is felt to be most appropriate for this patient at this time.  The patient did not have access to video technology/had technical difficulties with video requiring transitioning to audio format only (telephone).  All issues noted in this document were discussed and addressed.  No physical exam could be performed with this format.  Please refer to the patient's chart for his consent to telehealth for [facility].  Evaluation Performed:  Preoperative cardiovascular risk assessment _____________   Date:  08/25/2024   Patient ID:  Danny Morgan. Danny Morgan., DOB 1955/10/29, MRN [id number] Patient Location:  Home Provider location:   Office  Primary Care Provider:  Willo Mini, NP  Primary Cardiologist:  [facility] Providers Cardiologist:  Redell Shallow, MD     Patient contacted for telephone visit. He reports episodes of palpitations different from previous. He describes feeling his heart beating hard at times. He also feels alternating hard and soft beats. He is very concerned about this. Given his new symptoms decision was made to stop phone call and have patient contacted for in office visit. He is in agreement with plan.    Barnie Hila, NP  08/25/2024, 8:25 AM  "

## 2024-08-25 NOTE — Telephone Encounter (Signed)
 Per preop APP who s/w the pt this morning. Pt C/o palpitations. Per preop APP pt needs in office appt preop clearance and address the palpitations. Pt has been scheduled 09/13/24 Lum Louis, NP at the [facility] [address] address, pt aware of address. Pt said he does not know if he might need a heart monitor.

## 2024-08-25 NOTE — Progress Notes (Signed)
 Pt has appt 09/13/24 Lum Louis, NP

## 2024-09-08 ENCOUNTER — Ambulatory Visit

## 2024-09-08 DIAGNOSIS — E1149 Type 2 diabetes mellitus with other diabetic neurological complication: Secondary | ICD-10-CM | POA: Diagnosis not present

## 2024-09-08 LAB — POCT GLYCOSYLATED HEMOGLOBIN (HGB A1C): Hemoglobin A1C: 8.9 % — AB (ref 4.0–5.6)

## 2024-09-08 NOTE — Progress Notes (Signed)
" ° °  Subjective:    Patient ID: Danny Morgan. [name], male    DOB: [birth year], 69 y.o.   MRN: [id number]  HPI  Patient is here to have A1C checked. Last OV it was 11.3. denies CP, SOB, vision changes, excessive thirst, or medication problems.   Review of Systems     Objective:   Physical Exam        Assessment & Plan:   Patients A1C today in office is 8.9, patient had to leave advised we will call him with any changes. Sending  to PCP for review. "

## 2024-09-13 ENCOUNTER — Ambulatory Visit: Payer: Self-pay | Attending: Emergency Medicine | Admitting: Emergency Medicine

## 2024-09-13 NOTE — Progress Notes (Deleted)
" °  Cardiology Office Note:    Date:  09/13/2024  ID:  Danny Morgan. Danny Morgan., DOB Sep 23, 1955, MRN [id number] PCP: Danny Mini, NP  [facility] Providers Cardiologist:  Danny Shallow, MD { Click to update primary MD,subspecialty MD or APP then REFRESH:1}    {Click to Open Review  :1}   Patient Profile:       Chief Complaint: *** History of Present Illness:  Danny Morgan. [name]. is a 69 y.o. male with visit-pertinent history of palpitations, hyperlipidemia, T2DM, coronary calcifications, abdominal bruit  Previously evaluated by Dr. Edwyna and transferred care to Dr. Shallow open was first evaluated on 06/07/2023.  In 07/2020 patient had a nuclear study that showed ejection fraction of 67% with no evidence of ischemia or infarction.  Cardiac monitor 10/2022 showed an average heart rate of 79 bpm, predominant underlying rhythm was sinus rhythm, first-degree AV block was present, 1 run of ventricular tachycardia lasting 4 beats with max rate of 118 bpm, 33 runs of supraventricular tachycardia, the run with the fastest interval lasting 11 beats with a max rate of 187 bpm, longest lasting 19 beats with an average rate of 154 bpm, frequent PACs at 7.7%, no evidence of atrial fibrillation or flutter.  Echocardiogram 10/2022 indicated LVEF of 60 to 65%, no RWMA, normal diastolic parameters, no significant valvular abnormalities.  Underwent CT cardiac scoring on 06/2023 showing coronary calcium  score 204 (60th percentile).  Last seen in clinic on 12/24/2023.  Patient had reported that his palpitations have improved.  No further workup was indicated and he was to follow-up in 6 months.  Discussed the use of AI scribe software for clinical note transcription with the patient, who gave verbal consent to proceed.  History of Present Illness     Review of systems:  Please see the history of present illness. All other systems are reviewed and otherwise negative. ***      Studies Reviewed:         ***  Risk Assessment/Calculations:   {Does this patient have ATRIAL FIBRILLATION?:[id number]} No BP recorded.  {Refresh Note OR Click here to enter BP  :1}***        Physical Exam:   VS:  There were no vitals taken for this visit.   Wt Readings from Last 3 Encounters:  08/24/24 270 lb (122.5 kg)  06/01/24 257 lb (116.6 kg)  02/04/24 261 lb (118.4 kg)    GEN: Well nourished, well developed in no acute distress NECK: No JVD; No carotid bruits CARDIAC: ***RRR, no murmurs, rubs, gallops RESPIRATORY:  Clear to auscultation without rales, wheezing or rhonchi  ABDOMEN: Soft, non-tender, non-distended EXTREMITIES:  No edema; No acute deformity ***      Assessment and Plan:    Assessment and Plan Assessment & Plan      {Are you ordering a CV Procedure (e.g. stress test, cath, DCCV, TEE, etc)?   Press F2        :[id number]}  Dispo:  No follow-ups on file.  Signed, Lum Morgan Louis, NP  "

## 2024-09-26 NOTE — Progress Notes (Signed)
 Danny Morgan. Danny Morgan.                                          MRN: [id number]   09/26/2024   The VBCI Quality Team Specialist reviewed this patient medical record for the purposes of chart review for care gap closure. The following were reviewed: chart review for care gap closure-glycemic status assessment.    VBCI Quality Team

## 2024-09-26 NOTE — Progress Notes (Signed)
 Danny Morgan. Danny Morgan.                                          MRN: [id number]   09/26/2024   The VBCI Quality Team Specialist reviewed this patient medical record for the purposes of chart review for care gap closure. The following were reviewed: chart review for care gap closure-diabetic eye exam.    VBCI Quality Team

## 2024-10-05 ENCOUNTER — Telehealth: Payer: Self-pay

## 2024-10-05 DIAGNOSIS — I440 Atrioventricular block, first degree: Secondary | ICD-10-CM

## 2024-10-05 DIAGNOSIS — I471 Supraventricular tachycardia, unspecified: Secondary | ICD-10-CM

## 2024-10-05 NOTE — Telephone Encounter (Signed)
 The referral to cardiology has been signed.  As for clearance for surgery, I contacted the orthopedic office and discussed the requirements for him to proceed with surgery.  The orthopedic surgeon let me know that his A1c will need to be 7.5 or less.  Unfortunately, his last A1c was 8.9%.  Given if I were to send the clearance form over, the surgeon will not proceed if the A1c is not better. Thanks, Zada

## 2024-10-05 NOTE — Telephone Encounter (Signed)
 Pended cardiology referral if appropriate

## 2024-10-05 NOTE — Telephone Encounter (Signed)
 Copied from CRM #[id number]. Topic: Referral - Question >> Oct 05, 2024  3:15 PM Ivette P wrote: Reason for CRM: PT calling in because he needs clearance for surgery and would like to get a referral to a cardiologist.   Member was told by Suzen Ng that he had an appt for today. Pt still has the [facility]. And when he went they said he had no appt and next available is 11/06/2024 and pt says he is in a lot of pain and has had this issue since 02/2023  Pt would like for someone to help him find a place that would take him in sooner.     Suzen GORMAN Ng [facility] at [facility] Address: [address], [place], [place] [address]

## 2024-10-06 NOTE — Telephone Encounter (Signed)
 Spoke with patient - informed of message by Zada Palin, NP- he states he is going to try to work on the A1C  and will await phone call from cardiology.

## 2024-10-11 ENCOUNTER — Ambulatory Visit: Payer: Self-pay

## 2024-10-11 NOTE — Telephone Encounter (Signed)
 FYI Only or Action Required?: FYI only for provider: appointment scheduled on 10/12/24.  Patient was last seen in primary care on 08/24/2024 by Willo Mini, NP.  Called Nurse Triage reporting Cough, Nasal Congestion, and Facial Pain.  Symptoms began several days ago.  Interventions attempted: Other: Nasal rinses.  Symptoms are: unchanged.  Triage Disposition: See PCP When Office is Open (Within 3 Days)  Patient/caregiver understands and will follow disposition?: Yes  Summary: Sinus infection, productive cough, colored mucuous   Reason for Triage: Pt states he thinks he has a sinus infection, with productive cough with colored mucous, and pain, burning and stinging in sinus area, coughing and some shortness of breath.  Pt requesting an appt for evaluation.     Reason for Disposition  [1] Using nasal washes and pain medicine > 24 hours AND [2] sinus pain (around cheekbone or eye) persists  Answer Assessment - Initial Assessment Questions Patient states that he started to experience cough and congestion on Monday. Yesterday he started to feel burning and stinging in sinus area of face. He does report yellow, blood tinged mucus from nose and difficulty clearing nose. He has been using nasal rinses, but congestion comes back pretty quickly after. Office visit advised.   1. LOCATION: Where does it hurt?      Sinus area  2. ONSET: When did the sinus pain start?  (e.g., hours, days)      Monday  3. SEVERITY: How bad is the pain?   (Scale 0-10; or none, mild, moderate or severe)     Moderate burning and stinging  4. RECURRENT SYMPTOM: Have you ever had sinus problems before? If Yes, ask: When was the last time? and What happened that time?      Yes, has had recurrent sinus infections in the past  5. NASAL CONGESTION: Is the nose blocked? If Yes, ask: Can you open it or must you breathe through your mouth?     Yes, generally having to breathe through mouth  6. NASAL  DISCHARGE: Do you have discharge from your nose? If so ask, What color?     Yes, yellow and blood tinged  7. FEVER: Do you have a fever? If Yes, ask: What is it, how was it measured, and when did it start?      Felt like he may have had fever a couple days ago, but not sure  8. OTHER SYMPTOMS: Do you have any other symptoms? (e.g., sore throat, cough, earache, difficulty breathing)     Cough, congestion  9. PREGNANCY: Is there any chance you are pregnant? When was your last menstrual period?     NA  Protocols used: Sinus Pain or Congestion-A-AH

## 2024-10-11 NOTE — Telephone Encounter (Signed)
 FYI - the patient is scheduled on 10/12/24 at 1110 am with the provider.  Per E2C2 Triage RN - Pt states he thinks he has a sinus infection, with productive cough with colored mucous, and pain, burning and stinging in sinus area, coughing and some shortness of breath.

## 2024-10-12 ENCOUNTER — Ambulatory Visit: Admitting: Medical-Surgical

## 2024-10-12 ENCOUNTER — Encounter: Payer: Self-pay | Admitting: Medical-Surgical

## 2024-10-12 VITALS — BP 152/80 | HR 88 | Temp 99.3°F | Resp 20 | Ht 70.0 in | Wt 262.6 lb

## 2024-10-12 DIAGNOSIS — E1149 Type 2 diabetes mellitus with other diabetic neurological complication: Secondary | ICD-10-CM

## 2024-10-12 DIAGNOSIS — J069 Acute upper respiratory infection, unspecified: Secondary | ICD-10-CM

## 2024-10-12 LAB — POCT UA - MICROALBUMIN
Creatinine, POC: 300 mg/dL
Microalbumin Ur, POC: 150 mg/L

## 2024-10-12 LAB — POC COVID19/FLU A&B COMBO
Covid Antigen, POC: NEGATIVE
Influenza A Antigen, POC: NEGATIVE
Influenza B Antigen, POC: NEGATIVE

## 2024-10-12 MED ORDER — DEXAMETHASONE 4 MG PO TABS: 4.0000 mg | ORAL_TABLET | Freq: Two times a day (BID) | ORAL | 0 refills | Status: AC

## 2024-10-12 MED ORDER — AZELASTINE HCL 0.1 % NA SOLN: 2.0000 | Freq: Two times a day (BID) | NASAL | 11 refills | Status: AC

## 2024-10-12 NOTE — Progress Notes (Signed)
 "       Established patient visit   History of Present Illness   Discussed the use of AI scribe software for clinical note transcription with the patient, who gave verbal consent to proceed.  History of Present Illness   Danny Morgan. Danny Morgan. Danny Morgan is a 69 year old male who presents with symptoms suggestive of a sinus infection.  Upper respiratory and sinus symptoms - Onset of head congestion on Monday, progressing to cold-like symptoms by Monday night or Tuesday morning - Worsening symptoms over the past few days - Heavy nasal discharge despite frequent sinus flushing - Sore throat attributed to mouth breathing due to congestion - Headaches present - Subjective low-grade fever - Requires sleeping almost upright to breathe more easily, but wakes after about an hour to flush sinuses again - No use of over-the-counter medications except Advil - Afrin nasal spray used over the last couple of days to aid sleep - Atrovent  nasal spray available but avoided due to concern about dryness  Ocular symptoms - Eye pain with marked redness  Elevated blood pressure - Blood pressure higher than usual today, attributed to feeling ill  Environmental factors - 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  Constitutional:      General: He is not in acute distress.    Appearance: Normal appearance.  HENT:     Head: Normocephalic and atraumatic.  Cardiovascular:     Rate and Rhythm: Normal rate and regular rhythm.     Pulses: Normal pulses.     Heart sounds: Normal heart sounds. No murmur heard.    No friction rub. No gallop.  Pulmonary:     Effort: Pulmonary effort is normal. No respiratory distress.     Breath sounds: Normal breath sounds.  Skin:    General: Skin is warm and dry.  Neurological:     Mental Status: He is alert and oriented to person, place,  and time.  Psychiatric:        Mood and Affect: Mood normal.        Behavior: Behavior normal.        Thought Content: Thought content normal.        Judgment: Judgment normal.    Assessment & Plan   Acute upper respiratory infection Likely viral etiology. Discussed potential for secondary bacterial infection. Considered steroid use with caution due to blood sugar impact. - Prescribed Decadron  4 mg tablet twice daily for 5 days. - Prescribed azelastine  nasal spray twice daily as needed. - Advised against using Afrin for more than 3 days. - Recommended Coricidin or high blood pressure formula Nyquil/DayQuil for symptom relief. - Instructed to monitor symptoms and report if condition worsens or does not improve by Monday for potential antibiotic consideration.  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 Discussed Mounjaro  for glycemic control pending insurance. Emphasized dietary changes and exercise. Discussed steroid impact on blood sugar. - POCT microalbumin abnormal today. - If Mounjaro  proved and affordable, start as soon as possible. - Advised on dietary changes and exercise to improve glycemic control. - Monitor blood sugar levels closely, especially with steroid use.     Follow up   Return if symptoms worsen or fail to improve. __________________________________ Zada FREDRIK Palin, DNP, APRN, FNP-BC Primary Care and Sports Medicine  [facility] "

## 2024-10-13 ENCOUNTER — Ambulatory Visit: Admitting: Nurse Practitioner

## 2024-10-13 NOTE — Progress Notes (Unsigned)
 "  Office Visit    Patient Name: Danny Morgan. Danny Morgan. Date of Encounter: 10/13/2024  Primary Care Provider:  Willo Mini, NP Primary Cardiologist:  Danny Shallow, MD  Chief Complaint    70 year old male with a history of elevated coronary artery calcium  score, first-degree AV block, palpitations, PACs, NSVT, PSVT, abdominal aortic aneurysm, hyperlipidemia, type 2 diabetes, and GERD who presents for follow-up related to palpitations and for preoperative cardiac evaluation.  Past Medical History    Past Medical History:  Diagnosis Date   Adjustment disorder with mixed anxiety and depressed mood 09/17/2020   Allergy    Angio-edema    Anxiety    Body mass index (BMI) 39.0-39.9, adult 09/17/2020   Cardiac murmur 06/24/2020   Cataract    beginning stage in rt. eye   Chest discomfort 06/24/2020   Colon polyps    Diabetes mellitus without complication (HCC)    DOE (dyspnea on exertion) 06/24/2020   Eczema    Epistaxis 11/12/2020   Erectile dysfunction due to type 2 diabetes mellitus (HCC) 11/12/2020   GERD (gastroesophageal reflux disease)    Hyperlipidemia    states cholesterol normal states being given to protect kidneys   Impingement syndrome, shoulder, left 08/28/2021   Insomnia 09/17/2020   Lumbar spinal stenosis 08/28/2021   Morbid obesity (HCC) 06/24/2020   Neuropathy of both feet 09/17/2020   Palpitations 09/17/2020   Pilar cyst 09/17/2020   Stress due to family tension 09/17/2020   Tubular adenoma of colon 09/17/2020   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[facility])    Past Surgical History:  Procedure Laterality Date   COLONOSCOPY W/ BIOPSIES AND POLYPECTOMY     RETINAL DETACHMENT SURGERY     age 33   WISDOM TOOTH EXTRACTION      Allergies  Allergies[1]   Labs/Other Studies Reviewed    The following studies were reviewed today:  Cardiac Studies & Procedures    ______________________________________________________________________________________________   STRESS TESTS  MYOCARDIAL PERFUSION IMAGING 07/23/2020  Interpretation Summary  The left ventricular ejection fraction is hyperdynamic (>65%).  Nuclear stress EF: 67%.  There was no ST segment deviation noted during stress.  The study is normal.  This is a low risk study.  Normal stress nuclear study with no ischemia or infarction.  Gated ejection fraction 67% with normal wall motion.   ECHOCARDIOGRAM  ECHOCARDIOGRAM COMPLETE 11/04/2022  Narrative ECHOCARDIOGRAM REPORT    Patient Name:   Danny Morgan. Danny Morgan. Date of Exam: 11/04/2022 Medical Rec #:  [id number]              Height:       70.0 in Accession #:    [id number]             Weight:       256.0 lb Date of Birth:  1956-01-21              BSA:          2.318 m Patient Age:    66 years               BP:           141/83 mmHg Patient Gender: M                      HR:           76 bpm. Exam Location:  [address]  Procedure: 2D Echo, Cardiac Doppler, Color Doppler and 3D Echo  Indications:  Intermittent Palpitations R00.2  History:        Patient has prior history of Echocardiogram examinations, most recent 07/22/2020. Signs/Symptoms:Murmur; Risk Factors:Diabetes and Dyslipidemia.  Sonographer:    Augustin Seals RDCS Referring Phys: [id number] JOY JESSUP  IMPRESSIONS   1. Left ventricular ejection fraction, by estimation, is 60 to 65%. The left ventricle has normal function. The left ventricle has no regional wall motion abnormalities. Left ventricular diastolic parameters were normal. 2. Right ventricular systolic function is normal. The right ventricular size is normal. 3. The mitral valve is normal in structure. No evidence of mitral valve regurgitation. No evidence of mitral stenosis. 4. The aortic valve is normal in structure. Aortic valve regurgitation is not visualized. No aortic stenosis is  present. 5. The inferior vena cava is normal in size with greater than 50% respiratory variability, suggesting right atrial pressure of 3 mmHg.  FINDINGS Left Ventricle: Left ventricular ejection fraction, by estimation, is 60 to 65%. The left ventricle has normal function. The left ventricle has no regional wall motion abnormalities. The left ventricular internal cavity size was normal in size. There is no left ventricular hypertrophy. Left ventricular diastolic parameters were normal.  Right Ventricle: The right ventricular size is normal. No increase in right ventricular wall thickness. Right ventricular systolic function is normal.  Left Atrium: Left atrial size was normal in size.  Right Atrium: Right atrial size was normal in size.  Pericardium: There is no evidence of pericardial effusion.  Mitral Valve: The mitral valve is normal in structure. No evidence of mitral valve regurgitation. No evidence of mitral valve stenosis.  Tricuspid Valve: The tricuspid valve is normal in structure. Tricuspid valve regurgitation is not demonstrated. No evidence of tricuspid stenosis.  Aortic Valve: The aortic valve is normal in structure. Aortic valve regurgitation is not visualized. No aortic stenosis is present.  Pulmonic Valve: The pulmonic valve was normal in structure. Pulmonic valve regurgitation is not visualized. No evidence of pulmonic stenosis.  Aorta: The aortic root is normal in size and structure.  Venous: The inferior vena cava is normal in size with greater than 50% respiratory variability, suggesting right atrial pressure of 3 mmHg.  IAS/Shunts: No atrial level shunt detected by color flow Doppler.   LEFT VENTRICLE PLAX 2D LVIDd:         4.90 cm   Diastology LVIDs:         2.80 cm   LV e' medial:    7.18 cm/s LV PW:         1.10 cm   LV E/e' medial:  9.6 LV IVS:        1.10 cm   LV e' lateral:   7.62 cm/s LVOT diam:     2.40 cm   LV E/e' lateral: 9.0 LV SV:         96 LV  SV Index:   42 LVOT Area:     4.52 cm  3D Volume EF: 3D EF:        60 % LV EDV:       137 ml LV ESV:       55 ml LV SV:        83 ml  RIGHT VENTRICLE RV Basal diam:  4.00 cm RV Mid diam:    4.00 cm RV S prime:     10.19 cm/s TAPSE (M-mode): 2.2 cm  LEFT ATRIUM             Index  RIGHT ATRIUM           Index LA diam:        4.10 cm 1.77 cm/m   RA Area:     18.70 cm LA Vol (A2C):   68.1 ml 29.38 ml/m  RA Volume:   40.30 ml  17.38 ml/m LA Vol (A4C):   46.3 ml 19.97 ml/m LA Biplane Vol: 58.5 ml 25.23 ml/m AORTIC VALVE LVOT Vmax:   100.00 cm/s LVOT Vmean:  69.700 cm/s LVOT VTI:    0.213 m  AORTA Ao Root diam: 3.70 cm Ao Asc diam:  3.90 cm  MITRAL VALVE MV Area (PHT): 2.80 cm    SHUNTS MV Decel Time: 271 msec    Systemic VTI:  0.21 m MV E velocity: 68.70 cm/s  Systemic Diam: 2.40 cm MV A velocity: 60.80 cm/s MV E/A ratio:  1.13  Aditya Sabharwal Electronically signed by Ria Commander Signature Date/Time: 11/04/2022/3:27:38 PM    Final    MONITORS  LONG TERM MONITOR (3-14 DAYS) 10/26/2022  Narrative Patch Wear Time:  13 days and 23 hours (2024-01-28T11:00:48-0500 to 2024-02-11T11:00:44-0500)  Patient had a min HR of 43 bpm, max HR of 187 bpm, and avg HR of 79 bpm. Predominant underlying rhythm was Sinus Rhythm. First Degree AV Block was present. 1 run of Ventricular Tachycardia occurred lasting 4 beats with a max rate of 118 bpm (avg 115 bpm). 33 Supraventricular Tachycardia runs occurred, the run with the fastest interval lasting 11 beats with a max rate of 187 bpm, the longest lasting 19 beats with an avg rate of 154 bpm. Isolated SVEs were frequent (7.7%, [id number]), SVE Couplets were rare (<1.0%, 608), and SVE Triplets were rare (<1.0%, 65). Isolated VEs were rare (<1.0%), VE Couplets were rare (<1.0%), and no VE Triplets were present.  SUMMARY: The basic rhythm is normal sinus with an average HR of 79 bpm No atrial fibrillation or flutter No  high-grade heart block or pathologic pauses There are rare PVC's with no sustained ventricular runs (longest run 4 beats) Frequent supraventricular beats (7.7%) with short supraventricular runs   CT SCANS  CT CARDIAC SCORING (SELF PAY ONLY) 06/17/2023  Addendum 07/10/2023  7:31 PM ADDENDUM REPORT: 07/10/2023 19:28  EXAM: OVER-READ INTERPRETATION  CT CHEST  The following report is an over-read performed by radiologist Dr. Fonda Mom [facility] on 07/10/2023. This over-read does not include interpretation of cardiac or coronary anatomy or pathology. The coronary CTA interpretation by the cardiologist is attached.  COMPARISON:  None.  FINDINGS: Cardiovascular:  See findings discussed in the body of the report.  Mediastinum/Nodes: No suspicious adenopathy identified. Imaged mediastinal structures are unremarkable.  Lungs/Pleura: Imaged lungs are clear. No pleural effusion or pneumothorax.  Upper Abdomen: No acute abnormality.  Musculoskeletal: No chest wall abnormality. No acute osseous findings.  IMPRESSION: No acute extracardiac incidental findings identified.   Electronically Signed By: Fonda Field M.D. On: 07/10/2023 19:28  Narrative CLINICAL DATA:  Cardiovascular disease risk stratification  CAD screening, low CAD risk  EXAM: CT Coronary Calcium  Score  TECHNIQUE: A gated, non-contrast computed tomography scan of the heart was performed using 3mm slice thickness. Axial images were analyzed on a dedicated workstation. Calcium  scoring of the coronary arteries was performed using the Agatston method.  FINDINGS: Coronary Calcium  Score:  Left main: 0  Left anterior descending artery: 196  Left circumflex artery: 0  Right coronary artery: 8  Total: 204  Percentile: 60th  Pericardium: Normal.  Ascending Aorta: Borderline size. Ascending  aorta measures approximately 39mm at the mid ascending aorta measured in a non-contrast  axial plane.  Non-cardiac: See separate report from [facility].  IMPRESSION: Coronary calcium  score of 204. This was 60th percentile for age-, race-, and sex-matched controls.  RECOMMENDATIONS: Coronary artery calcium  (CAC) score is a strong predictor of incident coronary heart disease (CHD) and provides predictive information beyond traditional risk factors. CAC scoring is reasonable to use in the decision to withhold, postpone, or initiate statin therapy in intermediate-risk or selected borderline-risk asymptomatic adults (age 57-75 years and LDL-C >=70 to <190 mg/dL) who do not have diabetes or established atherosclerotic cardiovascular disease (ASCVD).* In intermediate-risk (10-year ASCVD risk >=7.5% to <20%) adults or selected borderline-risk (10-year ASCVD risk >=5% to <7.5%) adults in whom a CAC score is measured for the purpose of making a treatment decision the following recommendations have been made:  If CAC=0, it is reasonable to withhold statin therapy and reassess in 5 to 10 years, as long as higher risk conditions are absent (diabetes mellitus, family history of premature CHD in first degree relatives (males <55 years; females <65 years), cigarette smoking, or LDL >=190 mg/dL).  If CAC is 1 to 99, it is reasonable to initiate statin therapy for patients >=68 years of age.  If CAC is >=100 or >=75th percentile, it is reasonable to initiate statin therapy at any age.  Cardiology referral should be considered for patients with CAC scores >=400 or >=75th percentile.  *2018 AHA/ACC/AACVPR/AAPA/ABC/ACPM/ADA/AGS/APhA/ASPC/NLA/PCNA Guideline on the Management of Blood Cholesterol: A Report of the American College of Cardiology/American Heart Association Task Force on Clinical Practice Guidelines. J Am Coll Cardiol. 2019;73(24):3168-3209.  Electronically Signed: By: Soyla Merck M.D. On: 06/18/2023 09:26      ______________________________________________________________________________________________     Recent Labs: 06/01/2024: ALT 36; BUN 17; Creatinine, Ser 0.81; Potassium 4.5; Sodium 134  Recent Lipid Panel    Component Value Date/Time   CHOL 93 (L) 06/01/2024 1104   TRIG 55 06/01/2024 1104   HDL 37 (L) 06/01/2024 1104   CHOLHDL 2.5 06/01/2024 1104   CHOLHDL 2.9 09/11/2022 1030   LDLCALC 43 06/01/2024 1104   LDLCALC 73 09/11/2022 1030    History of Present Illness    69 year old male with the above past medical history including elevated coronary artery calcium  score, first-degree AV block, palpitations, PACs, NSVT, PSVT, abdominal aortic aneurysm, hyperlipidemia, type 2 diabetes, and GERD.  Patient was previously followed by Dr. Revankar.  He later transferred to Dr. Pietro.  Nuclear study in November 2021 revealed EF 67%, no evidence of ischemia or infarction.  He has a history of palpitations.  Cardiac monitor in 10/2022 showed an average heart rate of 79 bpm, predominant underlying rhythm was sinus rhythm, first-degree AV block was present, 1 run of ventricular tachycardia lasting 4 beats with max rate of 118 bpm, 33 runs of supraventricular tachycardia, the run with the fastest interval lasting 11 beats with a max rate of 187 bpm, longest lasting 19 beats with an average rate of 154 bpm, frequent PACs at 7.7%, no evidence of atrial fibrillation or flutter.  Echocardiogram in 10/2022 indicated LVEF of 60 to 65%, no RWMA, normal diastolic parameters, no significant valvular abnormalities.  Underwent CT cardiac scoring in 06/2023 which revealed coronary calcium  score of 204 (60th percentile).  He has a history of abdominal bruit.  Abdominal ultrasound in 06/2023 revealed a 3 cm proximal abdominal aortic aneurysm.  Follow-up imaging was recommended in 3 years.  He was last seen in the office  on 12/24/2023 and was stable from a cardiac standpoint.  He reported overall improvement in his  palpitations.  He denied symptoms concerning for angina.  He presents today for follow-up and for preoperative cardiac evaluation for lumbar decompression with Dr. Reyes Billing of [facility].  Since his last visit  Elevated coronary artery calcium  score: Palpitations/PACs/NSVT/PSVT: Abdominal aortic aneurysm: Hyperlipidemia: Type 2 diabetes: Disposition:  Home Medications    Current Outpatient Medications  Medication Sig Dispense Refill   amitriptyline  (ELAVIL ) 25 MG tablet Take 1 tablet (25 mg total) by mouth at bedtime. 90 tablet 3   Ascorbic Acid (VITAMIN C) 1000 MG tablet Take 1,000 mg by mouth daily.     azelastine  (ASTELIN ) 0.1 % nasal spray Place 2 sprays into both nostrils 2 (two) times daily. Use in each nostril as directed 30 mL 11   Berberine Chloride (BERBERINE HCI) 500 MG CAPS Take by mouth.     Biotin 5000 MCG TABS Take 1 tablet by mouth daily.     BL GINKGO BILOBA PO Take 60 mg by mouth.     cetirizine  (ZYRTEC ) 10 MG tablet Take 1 tablet (10 mg total) by mouth daily. 90 tablet 1   dexamethasone  (DECADRON ) 4 MG tablet Take 1 tablet (4 mg total) by mouth 2 (two) times daily with a meal. 10 tablet 0   doxycycline  (VIBRA -TABS) 100 MG tablet Take 1 tablet (100 mg total) by mouth 2 (two) times daily. 14 tablet 0   insulin  glargine (LANTUS ) 100 UNIT/ML injection Inject 0.1 mLs (10 Units total) into the skin daily. Increase by 2 units every 3-4 days if fasting glucose is greater than 120. If glucose less than 90, decrease by 2 units every 3-4 days. Max daily dose: 40 units. 10 mL 5   Insulin  Pen Needle (VERIFINE INSULIN  PEN NEEDLE) 32G X 6 MM MISC Use to inject Lantus  insulin  once daily. 100 each 11   ipratropium (ATROVENT ) 0.06 % nasal spray PLACE 2 SPRAYS INTO BOTH NOSTRILS 2 (TWO) TIMES DAILY 15 mL 6   Magnesium 200 MG TABS Take 1 tablet by mouth at bedtime.     meloxicam  (MOBIC ) 15 MG tablet Take 15 mg by mouth daily.     MOUNJARO  2.5 MG/0.5ML Pen Inject 2.5 mg into the skin  once a week.     omeprazole (PRILOSEC) 10 MG capsule Take 10 mg by mouth daily.      Probiotic Product (PROBIOTIC PO) Take by mouth.     SUPER B COMPLEX/C PO Take 500 mg by mouth.     temazepam  (RESTORIL ) 15 MG capsule TAKE 1-2 CAPSULES (15-30 MG TOTAL) BY MOUTH AT BEDTIME AS NEEDED FOR SLEEP. 60 capsule 2   VITAMIN D-VITAMIN K PO Take by mouth.     No current facility-administered medications for this visit.     Review of Systems    ***.  All other systems reviewed and are otherwise negative except as noted above.    Physical Exam    VS:  There were no vitals taken for this visit. , BMI There is no height or weight on file to calculate BMI.     GEN: Well nourished, well developed, in no acute distress. HEENT: normal. Neck: Supple, no JVD, carotid bruits, or masses. Cardiac: RRR, no murmurs, rubs, or gallops. No clubbing, cyanosis, edema.  Radials/DP/PT 2+ and equal bilaterally.  Respiratory:  Respirations regular and unlabored, clear to auscultation bilaterally. GI: Soft, nontender, nondistended, BS + x 4. MS: no deformity or atrophy. Skin: warm  and dry, no rash. Neuro:  Strength and sensation are intact. Psych: Normal affect.  Accessory Clinical Findings    ECG personally reviewed by me today -    - no acute changes.   Lab Results  Component Value Date   WBC 6.8 10/11/2023   HGB 15.8 10/11/2023   HCT 51.2 (H) 10/11/2023   MCV 85 10/11/2023   PLT 247 10/11/2023   Lab Results  Component Value Date   CREATININE 0.81 06/01/2024   BUN 17 06/01/2024   NA 134 06/01/2024   K 4.5 06/01/2024   CL 99 06/01/2024   CO2 20 06/01/2024   Lab Results  Component Value Date   ALT 36 06/01/2024   AST 30 06/01/2024   ALKPHOS 108 06/01/2024   BILITOT 0.5 06/01/2024   Lab Results  Component Value Date   CHOL 93 (L) 06/01/2024   HDL 37 (L) 06/01/2024   LDLCALC 43 06/01/2024   TRIG 55 06/01/2024   CHOLHDL 2.5 06/01/2024    Lab Results  Component Value Date   HGBA1C 8.9  (A) 09/08/2024    Assessment & Plan    1.  ***      Damien JAYSON Braver, NP 10/13/2024, 5:56 AM       [1]  Allergies Allergen Reactions   Penicillins Shortness Of Breath and Swelling   Cephalexin Diarrhea   "

## 2024-11-06 ENCOUNTER — Ambulatory Visit: Admitting: Cardiology

## 2024-12-14 ENCOUNTER — Ambulatory Visit

## 2024-12-14 ENCOUNTER — Ambulatory Visit: Admitting: Nurse Practitioner
